# Patient Record
Sex: Male | Born: 1950 | Race: Black or African American | Hispanic: No | Marital: Married | Smoking: Current every day smoker
Health system: Southern US, Community
[De-identification: ages and names within clinical notes are randomized; demographics above are authoritative.]

## PROBLEM LIST (undated history)

## (undated) DIAGNOSIS — Z720 Tobacco use: Secondary | ICD-10-CM

## (undated) DIAGNOSIS — D649 Anemia, unspecified: Secondary | ICD-10-CM

## (undated) DIAGNOSIS — E538 Deficiency of other specified B group vitamins: Secondary | ICD-10-CM

## (undated) DIAGNOSIS — N529 Male erectile dysfunction, unspecified: Secondary | ICD-10-CM

## (undated) DIAGNOSIS — R972 Elevated prostate specific antigen [PSA]: Secondary | ICD-10-CM

## (undated) DIAGNOSIS — I10 Essential (primary) hypertension: Secondary | ICD-10-CM

## (undated) HISTORY — PX: OTHER SURGICAL HISTORY: SHX169

## (undated) HISTORY — DX: Essential (primary) hypertension: I10

## (undated) HISTORY — DX: Deficiency of other specified B group vitamins: E53.8

## (undated) HISTORY — DX: Tobacco use: Z72.0

## (undated) HISTORY — DX: Anemia, unspecified: D64.9

## (undated) HISTORY — DX: Elevated prostate specific antigen (PSA): R97.20

## (undated) HISTORY — DX: Male erectile dysfunction, unspecified: N52.9

---

## 2007-02-15 ENCOUNTER — Ambulatory Visit: Payer: Self-pay | Admitting: Gastroenterology

## 2014-10-07 ENCOUNTER — Inpatient Hospital Stay: Payer: BC Managed Care – PPO

## 2014-10-07 ENCOUNTER — Encounter: Payer: Self-pay | Admitting: Internal Medicine

## 2014-10-07 ENCOUNTER — Inpatient Hospital Stay: Payer: BC Managed Care – PPO | Attending: Internal Medicine | Admitting: Internal Medicine

## 2014-10-07 VITALS — BP 124/85 | HR 64 | Temp 98.4°F | Resp 18 | Ht 66.6 in | Wt 180.6 lb

## 2014-10-07 DIAGNOSIS — I10 Essential (primary) hypertension: Secondary | ICD-10-CM | POA: Insufficient documentation

## 2014-10-07 DIAGNOSIS — F17210 Nicotine dependence, cigarettes, uncomplicated: Secondary | ICD-10-CM

## 2014-10-07 DIAGNOSIS — D649 Anemia, unspecified: Secondary | ICD-10-CM | POA: Diagnosis not present

## 2014-10-07 DIAGNOSIS — Z79899 Other long term (current) drug therapy: Secondary | ICD-10-CM | POA: Insufficient documentation

## 2014-10-07 DIAGNOSIS — E538 Deficiency of other specified B group vitamins: Secondary | ICD-10-CM | POA: Insufficient documentation

## 2014-10-07 DIAGNOSIS — N529 Male erectile dysfunction, unspecified: Secondary | ICD-10-CM | POA: Insufficient documentation

## 2014-10-07 LAB — IRON AND TIBC
IRON: 72 ug/dL (ref 45–182)
SATURATION RATIOS: 19 % (ref 17.9–39.5)
TIBC: 376 ug/dL (ref 250–450)
UIBC: 304 ug/dL

## 2014-10-07 LAB — FERRITIN: FERRITIN: 120 ng/mL (ref 24–336)

## 2014-10-07 LAB — LACTATE DEHYDROGENASE: LDH: 131 U/L (ref 98–192)

## 2014-10-07 LAB — FOLATE: FOLATE: 8.4 ng/mL (ref >5.9)

## 2014-10-07 LAB — DAT, POLYSPECIFIC AHG (ARMC ONLY): POLYSPECIFIC AHG TEST: NEGATIVE

## 2014-10-07 NOTE — Progress Notes (Signed)
Pt here for anemia, he does feel tired after a long day work sometimes.  Had colonoscopy 2012 few polyps. Does not see blood or stool in urine

## 2014-10-08 LAB — RETICULOCYTES
RBC.: 4.64 MIL/uL (ref 4.40–5.90)
RETIC COUNT ABSOLUTE: 46.4 10*3/uL (ref 19.0–183.0)
Retic Ct Pct: 1 % (ref 0.4–3.1)

## 2014-10-08 LAB — KAPPA/LAMBDA LIGHT CHAINS
Kappa free light chain: 28.82 mg/L — ABNORMAL HIGH (ref 3.30–19.40)
Kappa, lambda light chain ratio: 1.87 — ABNORMAL HIGH (ref 0.26–1.65)
LAMDA FREE LIGHT CHAINS: 15.41 mg/L (ref 5.71–26.30)

## 2014-10-08 LAB — PROTEIN ELECTROPHORESIS, SERUM
A/G RATIO SPE: 1 (ref 0.7–1.7)
ALBUMIN ELP: 3.7 g/dL (ref 2.9–4.4)
ALPHA-2-GLOBULIN: 0.8 g/dL (ref 0.4–1.0)
Alpha-1-Globulin: 0.3 g/dL (ref 0.0–0.4)
BETA GLOBULIN: 1.1 g/dL (ref 0.7–1.3)
GLOBULIN, TOTAL: 3.7 g/dL (ref 2.2–3.9)
Gamma Globulin: 1.6 g/dL (ref 0.4–1.8)
Total Protein ELP: 7.4 g/dL (ref 6.0–8.5)

## 2014-10-08 LAB — HAPTOGLOBIN: Haptoglobin: 167 mg/dL (ref 34–200)

## 2014-10-08 LAB — ERYTHROPOIETIN: ERYTHROPOIETIN: 10.9 m[IU]/mL (ref 2.6–18.5)

## 2014-10-08 LAB — ANA W/REFLEX: Anti Nuclear Antibody(ANA): NEGATIVE

## 2014-10-08 LAB — CBC
HEMATOCRIT: 39.1 % — AB (ref 40.0–52.0)
HEMOGLOBIN: 12.6 g/dL — AB (ref 13.0–18.0)
MCH: 27.2 pg (ref 26.0–34.0)
MCHC: 32.3 g/dL (ref 32.0–36.0)
MCV: 84.2 fL (ref 80.0–100.0)
Platelets: 219 10*3/uL (ref 150–440)
RBC: 4.64 MIL/uL (ref 4.40–5.90)
RDW: 14.5 % (ref 11.5–14.5)
WBC: 7.1 10*3/uL (ref 3.8–10.6)

## 2014-10-09 NOTE — Progress Notes (Signed)
[place]  Telephone:(336) [phone] Fax:(336) [phone]     ID: Dierdre Forth OB: Aug 08, 1950  MR#: [id number]  VAN#:[id number]  Patient Care Team: Marden Noble, MD as PCP - General (Internal Medicine)  CHIEF COMPLAINT/DIAGNOSIS:  Recently diagnosed Anemia  -  patient referred here for Hematology evaluation.  (labs on 09/18/14 showed hemoglobin 12.1, MCV 84.2, hematocrit 37.8%, WBC 5.8, ANC 3.6, platelet count 233, creatinine 1.1, calcium 9.6, LFTs unremarkable, HIV 1/2 negative, HCV antibody negative. Prior to this in August 2015 hemoglobin was better at 13.3, WBC 6.2, platelets 224).   HISTORY OF PRESENT ILLNESS:  Dale Palmer is a 64 year old gentleman with past medical history significant for hypertension, anemia, B-12 deficiency, erectile dysfunction who has been referred here for anemia evaluation. Recent labs done on 09/18/14 showed hemoglobin 12.1, MCV 84.2, hematocrit 37.8%, WBC 5.8, ANC 3.6, platelet count 233, creatinine 1.1, calcium 9.6, LFTs unremarkable, HIV 1/2 negative, HCV antibody negative. Prior to this in August 2015 hemoglobin was doing better at 13.3, WBC 6.2, platelets 224. Clinically patient states that he has mild fatigue on doing physical activity only. Otherwise denies any dyspnea, angina, palpitation, orthopnea or PND. He denies any bleeding symptoms. States that colonoscopy was in 2012, one benign polyp was removed and was told that next one would be at 5 years. Denies any new bone pains. No new paresthesias in the extremities. States that he eats well, denies unintentional weight loss.  REVIEW OF SYSTEMS:   ROS CONSTITUTIONAL: As in HPI above. No chills, fever or sweats.    ENT:  No headache, dizziness or epistaxis. No ear or jaw pain. No sinus symptoms. RESPIRATORY:   No cough.  No shortness of breath. No wheezing. No hemoptysis. CARDIAC:  No palpitations.  No retrosternal chest pain. No orthopnea, PND. GI:  No abdominal pain, nausea or  vomiting. No diarrhea.   GU:  No dysuria or hematuria.  SKIN: No rashes or pruritus. HEMATOLOGIC: denies bleeding symptoms MUSCULOSKELETAL:  No new bone pains.  EXTREMITY:  No new swelling or pain.  NEURO:  No focal weakness. No numbness or tingling of extremities.  No seizures.   ENDOCRINE:  No polyuria or polydipsia.   PAST MEDICAL HISTORY: Reviewed. Past Medical History  Diagnosis Date  . Hypertension   . Anemia   . B12 deficiency   . Erectile dysfunction     PAST SURGICAL HISTORY: Reviewed. History reviewed. No pertinent past surgical history.  FAMILY HISTORY: Reviewed. History reviewed. No pertinent family history.  SOCIAL HISTORY: Reviewed. Social History  Substance Use Topics  . Smoking status: Current Every Day Smoker -- 0.50 packs/day  . Smokeless tobacco: None  . Alcohol Use: Yes     Comment: occasional basis like at a birthday party    Allergies not on file  Current Outpatient Prescriptions  Medication Sig Dispense Refill  . Cyanocobalamin (VITAMIN B 12 PO) Take 1,000 mcg by mouth daily.    Marland Kitchen lisinopril-hydrochlorothiazide (PRINZIDE,ZESTORETIC) 20-25 MG per tablet Take 1 tablet by mouth daily.     No current facility-administered medications for this visit.    PHYSICAL EXAM: Filed Vitals:   10/07/14 1421  BP: 124/85  Pulse: 64  Temp: 98.4 F (36.9 C)  Resp: 18     Body mass index is 28.61 kg/(m^2).      GENERAL: Patient is alert and oriented and in no acute distress. There is no icterus or pallor. HEENT: EOMs intact. No cervical lymphadenopathy. CVS: S1S2, regular LUNGS: Bilaterally clear to auscultation,  no rhonchi. ABDOMEN: Soft, nontender. No hepatosplenomegaly clinically.  NEURO: grossly nonfocal, cranial nerves are intact.  EXTREMITIES: No pedal edema. LYMPHATICS: No palpable adenopathy in axillary or inguinal areas. SKIN: No rash or major bruising. MUSCULOSKELETAL: No obvious joint redness or swelling.   LAB RESULTS: 09/18/14 showed  hemoglobin 12.1, MCV 84.2, hematocrit 37.8%, WBC 5.8, ANC 3.6, platelet count 233, creatinine 1.1, calcium 9.6, LFTs unremarkable, HIV 1/2 negative, HCV antibody negative.  August 2015 - hemoglobin was 13.3, WBC 6.2, platelets 224.   ASSESSMENT / PLAN:   Recently diagnosed Anemia   (labs on 09/18/14 showed hemoglobin 12.1, MCV 84.2, hematocrit 37.8%, WBC 5.8, ANC 3.6, platelet count 233, creatinine 1.1, calcium 9.6, LFTs unremarkable, HIV 1/2 negative, HCV antibody negative. Prior to this in August 2015 hemoglobin was better at 13.3, WBC 6.2, platelets 224)  -  patient referred here for Hematology evaluation. Have reviewed records and labs sent by referring physician and discussed with patient. He has mild anemia of unclear etiology. Recent anemia workup including CBC with differential, reticulocyte count, B12, folate, iron study, LDH/haptoglobin/DAT test to rule out hemolysis, serum EPO level, ANA, serum protein electrophoresis and serum kappa/lambda light chain ratio. Will follow up in 1 week and make further recommendations based upon results of workup, including decide if he would need bone marrow biopsy evaluation. Patient otherwise does not have significant anemia symptoms. In between visits, the patient has been advised to call or come to the ER in case of fevers, bleeding, acute sickness or new symptoms. Patient agreeable to this plan.   Leia Alf, MD   10/09/2014 12:28 PM

## 2014-10-14 ENCOUNTER — Ambulatory Visit: Payer: BC Managed Care – PPO | Admitting: Internal Medicine

## 2014-10-21 ENCOUNTER — Other Ambulatory Visit: Payer: Self-pay

## 2014-10-21 ENCOUNTER — Inpatient Hospital Stay (HOSPITAL_BASED_OUTPATIENT_CLINIC_OR_DEPARTMENT_OTHER): Payer: BC Managed Care – PPO | Admitting: Internal Medicine

## 2014-10-21 VITALS — BP 119/73 | HR 62 | Temp 98.2°F | Resp 18 | Ht 66.6 in | Wt 182.5 lb

## 2014-10-21 DIAGNOSIS — E538 Deficiency of other specified B group vitamins: Secondary | ICD-10-CM

## 2014-10-21 DIAGNOSIS — F17210 Nicotine dependence, cigarettes, uncomplicated: Secondary | ICD-10-CM | POA: Diagnosis not present

## 2014-10-21 DIAGNOSIS — N529 Male erectile dysfunction, unspecified: Secondary | ICD-10-CM

## 2014-10-21 DIAGNOSIS — D649 Anemia, unspecified: Secondary | ICD-10-CM | POA: Diagnosis not present

## 2014-10-21 DIAGNOSIS — I10 Essential (primary) hypertension: Secondary | ICD-10-CM | POA: Diagnosis not present

## 2014-10-21 DIAGNOSIS — Z79899 Other long term (current) drug therapy: Secondary | ICD-10-CM

## 2014-10-21 LAB — OCCULT BLOOD X 1 CARD TO LAB, STOOL
FECAL OCCULT BLD: NEGATIVE
FECAL OCCULT BLD: NEGATIVE

## 2014-11-01 NOTE — Progress Notes (Signed)
[place]  Telephone:([phone] Fax:(336) [phone]     ID: Dierdre Forth OB: July 25, 1950  MR#: [id number]  GYF#:[id number]  Patient Care Team: Marden Noble, MD as PCP - General (Internal Medicine)  CHIEF COMPLAINT/DIAGNOSIS:  Recently diagnosed Anemia of unclear etiology - mild, asymptomatic.  Workup done on 10/07/14 - hemoglobin 12.6, MCV 84, WBC 7.1, platelets 290. Iron study unremarkable. LDH/haptoglobin/DHD test unremarkable. SPEP negative for M-spike. Serum kappa light chains mildly elevated at 28.82, lambda light chains normal at 15.41, serum kappa/lambda ratio mildly abnormal at 1.87. Serum EPO 10.9. ANA negative. Stool Hemoccult negative 2.  (Prior labs on 09/18/14 showed hemoglobin 12.1, MCV 84.2, hematocrit 37.8%, WBC 5.8, ANC 3.6, platelet count 233, creatinine 1.1, calcium 9.6, LFTs unremarkable, HIV 1/2 negative, HCV antibody negative. Prior to this in August 2015 hemoglobin was better at 13.3, WBC 6.2, platelets 224).   HISTORY OF PRESENT ILLNESS:  Patient returns for continued hematology follow-up, he had labs done on September 14 as described above. Clinically states that he is doing well, he gets mild fatigue on doing physical activity only. Otherwise denies any dyspnea, angina, palpitation, orthopnea or PND. He denies any bleeding symptoms. States that colonoscopy was in 2012, one benign polyp was removed and was told that next one would be at 5 years. States that he eats well.  REVIEW OF SYSTEMS:   ROS As in HPI above. No chills, fever or sweats.  No shortness of breath. No wheezing. No abdominal pain, nausea or vomiting. No diarrhea. No new bone pains.   PAST MEDICAL HISTORY: Reviewed. Past Medical History  Diagnosis Date  . Hypertension   . Anemia   . B12 deficiency   . Erectile dysfunction     PAST SURGICAL HISTORY: Reviewed. No past surgical history on file.  FAMILY HISTORY: Reviewed. No family history on file.  SOCIAL HISTORY:  Reviewed. Social History  Substance Use Topics  . Smoking status: Current Every Day Smoker -- 0.50 packs/day  . Smokeless tobacco: Not on file  . Alcohol Use: Yes     Comment: occasional basis like at a birthday party    Allergies not on file  Current Outpatient Prescriptions  Medication Sig Dispense Refill  . Cyanocobalamin (VITAMIN B 12 PO) Take 1,000 mcg by mouth daily.    Marland Kitchen lisinopril-hydrochlorothiazide (PRINZIDE,ZESTORETIC) 20-25 MG per tablet Take 1 tablet by mouth daily.     No current facility-administered medications for this visit.    PHYSICAL EXAM: Filed Vitals:   10/21/14 1433  BP: 119/73  Pulse: 62  Temp: 98.2 F (36.8 C)  Resp: 18     Body mass index is 28.92 kg/(m^2).      GENERAL: Patient is alert and oriented and in no acute distress. There is no icterus or pallor. LUNGS: Bilaterally clear to auscultation, no rhonchi. ABDOMEN: Soft, nontender.   EXTREMITIES: No pedal edema.   LAB RESULTS: 09/18/14 showed hemoglobin 12.1, MCV 84.2, hematocrit 37.8%, WBC 5.8, ANC 3.6, platelet count 233, creatinine 1.1, calcium 9.6, LFTs unremarkable, HIV 1/2 negative, HCV antibody negative.  August 2015 - hemoglobin was 13.3, WBC 6.2, platelets 224.   ASSESSMENT / PLAN:   Recently diagnosed Anemia of unclear etiology  (Workup done on 10/07/14 - hemoglobin 12.6, MCV 84, WBC 7.1, platelets 290. Iron study unremarkable. LDH/haptoglobin/DHD test unremarkable. SPEP negative for M-spike. Serum kappa light chains mildly elevated at 28.82, lambda light chains normal at 15.41, serum kappa/lambda ratio mildly abnormal at 1.87. Serum EPO 10.9. ANA negative. Stool  Hemoccult negative 2)  -  reviewed labs done and discussed with patient. Hemoglobin is just below normal range and he does not have any symptoms. Given this, plan is continued monitoring and will see him back at 24 weeks with repeat labs. He has mildly elevated serum kappa light chains and mildly elevated serum kappa/lambda  ratio, this is likely not significant at this time but will continue to monitor and repeat these levels when he comes back at 24 weeks.  In between visits, the patient has been advised to call or come to the ER in case of fevers, bleeding, acute sickness or new symptoms. Patient agreeable to this plan.   Leia Alf, MD   11/01/2014 9:09 AM

## 2015-04-06 ENCOUNTER — Other Ambulatory Visit: Payer: Self-pay | Admitting: *Deleted

## 2015-04-06 DIAGNOSIS — D649 Anemia, unspecified: Secondary | ICD-10-CM

## 2015-04-07 ENCOUNTER — Inpatient Hospital Stay: Payer: BC Managed Care – PPO

## 2015-04-07 ENCOUNTER — Inpatient Hospital Stay: Payer: BC Managed Care – PPO | Admitting: Internal Medicine

## 2015-04-29 ENCOUNTER — Inpatient Hospital Stay: Payer: BC Managed Care – PPO | Attending: Internal Medicine

## 2015-04-29 ENCOUNTER — Inpatient Hospital Stay: Payer: BC Managed Care – PPO | Admitting: Internal Medicine

## 2015-09-22 DIAGNOSIS — R972 Elevated prostate specific antigen [PSA]: Secondary | ICD-10-CM | POA: Insufficient documentation

## 2015-09-22 HISTORY — DX: Elevated prostate specific antigen (PSA): R97.20

## 2016-09-27 ENCOUNTER — Telehealth: Payer: Self-pay | Admitting: *Deleted

## 2016-09-27 NOTE — Telephone Encounter (Signed)
Received referral for low dose lung cancer screening CT scan. Attempted to leave message at phone number listed in EMR for patient to call me back to facilitate scheduling scan. However, this option is not available. Will attempt to contact at later date.

## 2016-09-28 ENCOUNTER — Telehealth: Payer: Self-pay | Admitting: *Deleted

## 2016-09-28 NOTE — Telephone Encounter (Signed)
Received referral for low dose lung cancer screening CT scan. Attempted to leave message at phone number listed in EMR for patient to call me back to facilitate scheduling scan. However, this option is not available. 

## 2016-10-02 ENCOUNTER — Telehealth: Payer: Self-pay | Admitting: *Deleted

## 2016-10-02 DIAGNOSIS — Z87891 Personal history of nicotine dependence: Secondary | ICD-10-CM

## 2016-10-02 DIAGNOSIS — Z122 Encounter for screening for malignant neoplasm of respiratory organs: Secondary | ICD-10-CM

## 2016-10-02 NOTE — Telephone Encounter (Signed)
Received referral for initial lung cancer screening scan. Contacted patient and obtained smoking history,(current, 33 pack year) as well as answering questions related to screening process. Patient denies signs of lung cancer such as weight loss or hemoptysis. Patient denies comorbidity that would prevent curative treatment if lung cancer were found. Patient is scheduled for shared decision making visit and CT scan on 10/11/16.

## 2016-10-11 ENCOUNTER — Inpatient Hospital Stay: Payer: BC Managed Care – PPO | Attending: Nurse Practitioner | Admitting: Nurse Practitioner

## 2016-10-11 ENCOUNTER — Ambulatory Visit
Admission: RE | Admit: 2016-10-11 | Discharge: 2016-10-11 | Disposition: A | Discharge status: Home / Self Care | Payer: BC Managed Care – PPO | Source: Ambulatory Visit | Attending: Nurse Practitioner | Admitting: Nurse Practitioner

## 2016-10-11 ENCOUNTER — Encounter: Payer: Self-pay | Admitting: Nurse Practitioner

## 2016-10-11 DIAGNOSIS — F17210 Nicotine dependence, cigarettes, uncomplicated: Secondary | ICD-10-CM

## 2016-10-11 DIAGNOSIS — Z122 Encounter for screening for malignant neoplasm of respiratory organs: Secondary | ICD-10-CM | POA: Insufficient documentation

## 2016-10-11 DIAGNOSIS — I700 Atherosclerosis of aorta: Secondary | ICD-10-CM | POA: Diagnosis not present

## 2016-10-11 DIAGNOSIS — Z720 Tobacco use: Secondary | ICD-10-CM

## 2016-10-11 DIAGNOSIS — Z87891 Personal history of nicotine dependence: Secondary | ICD-10-CM | POA: Insufficient documentation

## 2016-10-11 DIAGNOSIS — J439 Emphysema, unspecified: Secondary | ICD-10-CM | POA: Diagnosis not present

## 2016-10-11 DIAGNOSIS — I2510 Atherosclerotic heart disease of native coronary artery without angina pectoris: Secondary | ICD-10-CM | POA: Insufficient documentation

## 2016-10-11 HISTORY — DX: Tobacco use: Z72.0

## 2016-10-11 NOTE — Progress Notes (Signed)
In accordance with CMS guidelines, patient has met eligibility criteria including age, absence of signs or symptoms of lung cancer.  Social History  Substance Use Topics  . Smoking status: Current Every Day Smoker    Packs/day: 0.75    Years: 44.00  . Smokeless tobacco: Not on file  . Alcohol use Yes     Comment: occasional basis like at a birthday party     A shared decision-making session was conducted prior to the performance of CT scan. This includes one or more decision aids, includes benefits and harms of screening, follow-up diagnostic testing, over-diagnosis, false positive rate, and total radiation exposure.  Counseling on the importance of adherence to annual lung cancer LDCT screening, impact of co-morbidities, and ability or willingness to undergo diagnosis and treatment is imperative for compliance of the program.  Counseling on the importance of continued smoking cessation for former smokers; the importance of smoking cessation for current smokers, and information about tobacco cessation interventions have been given to patient including [place] and [facility] quit [place] programs.  Written order for lung cancer screening with LDCT has been given to the patient and any and all questions have been answered to the best of my abilities.   Yearly follow up will be coordinated by Burgess Estelle, Thoracic Navigator.  Verlon Au NP 10/11/16 4:19 PM

## 2016-10-13 ENCOUNTER — Encounter: Payer: Self-pay | Admitting: *Deleted

## 2017-03-27 ENCOUNTER — Encounter: Payer: Self-pay | Admitting: Urology

## 2017-03-27 ENCOUNTER — Ambulatory Visit: Payer: BC Managed Care – PPO | Admitting: Urology

## 2017-03-27 VITALS — BP 120/73 | HR 66 | Ht 67.0 in | Wt 191.0 lb

## 2017-03-27 DIAGNOSIS — R3915 Urgency of urination: Secondary | ICD-10-CM

## 2017-03-27 DIAGNOSIS — N400 Enlarged prostate without lower urinary tract symptoms: Secondary | ICD-10-CM

## 2017-03-27 DIAGNOSIS — F17200 Nicotine dependence, unspecified, uncomplicated: Secondary | ICD-10-CM

## 2017-03-27 DIAGNOSIS — R3129 Other microscopic hematuria: Secondary | ICD-10-CM | POA: Diagnosis not present

## 2017-03-27 DIAGNOSIS — N401 Enlarged prostate with lower urinary tract symptoms: Secondary | ICD-10-CM

## 2017-03-27 LAB — URINALYSIS, COMPLETE
BILIRUBIN UA: NEGATIVE
Glucose, UA: NEGATIVE
KETONES UA: NEGATIVE
Nitrite, UA: NEGATIVE
PH UA: 6 (ref 5.0–7.5)
PROTEIN UA: NEGATIVE
SPEC GRAV UA: 1.01 (ref 1.005–1.030)
UUROB: 0.2 mg/dL (ref 0.2–1.0)

## 2017-03-27 LAB — MICROSCOPIC EXAMINATION: Bacteria, UA: NONE SEEN

## 2017-03-27 LAB — BLADDER SCAN AMB NON-IMAGING

## 2017-03-27 NOTE — Progress Notes (Signed)
03/27/2017 1:19 PM   Dale Palmer 1950/10/05 [id number]  Referring provider: Marden Noble, MD [address] [place], [place] [address]  Chief Complaint  Patient presents with  . Benign Prostatic Hypertrophy    New Patient    HPI: 67 yo M referred for further evaluation of worsening urinary symptoms.  Over the past year or so, he has noticed increased daytime urinary frequency and urgency.  This is somewhat bothersome to him.  He reports that he feels like fluids go right through him.  He drinks a cup of coffee in the morning and has to void urgently upon arriving to work.  He then takes a urinary break just shortly after that.  He drinks a large ice tea from [employer] at lunchtime and voids currently in the afternoon following this as well.  He does have some urgency but no urge incontinence.  He has minimal nighttime symptoms.  He denies any gross hematuria, UTIs, or flank pain.  He does take lisinopril/hydrochlorothiazide in the mornings.  He is a mildly elevated/fluctuating PSA.  PSA trend as below.  He is a current smoker.  He does have evidence of microscopic hematuria today on urinalysis.  No previous UAs for review.  He has been on Flomax for the past several years.  He has never had a urologic evaluation in the past.  PSA trend: 3.68 on 03/2017 4.38 on 08/2016 3.55 on 02/2016 5.95 on 08/2015 3.27 on 08/2013   IPSS    Row Name 03/27/17 1000         International Prostate Symptom Score   How often have you had the sensation of not emptying your bladder?  Less than 1 in 5     How often have you had to urinate less than every two hours?  About half the time     How often have you found you stopped and started again several times when you urinated?  About half the time     How often have you found it difficult to postpone urination?  About half the time     How often have you had a weak urinary stream?  Not at All     How often have you had to strain to start  urination?  Not at All     How many times did you typically get up at night to urinate?  None     Total IPSS Score  10       Quality of Life due to urinary symptoms   If you were to spend the rest of your life with your urinary condition just the way it is now how would you feel about that?  Unhappy        Score:  1-7 Mild 8-19 Moderate 20-35 Severe   PMH: Past Medical History:  Diagnosis Date  . Anemia   . B12 deficiency   . Erectile dysfunction   . Hypertension     Surgical History: Past Surgical History:  Procedure Laterality Date  . none      Home Medications:  Allergies as of 03/27/2017   No Known Allergies     Medication List        Accurate as of 03/27/17  1:19 PM. Always use your most recent med list.          aspirin EC 81 MG tablet TAKE 1 TABLET (81 MG TOTAL) BY MOUTH ONCE DAILY.   lisinopril-hydrochlorothiazide 20-25 MG tablet Commonly known as:  PRINZIDE,ZESTORETIC Take  1 tablet by mouth daily.   tamsulosin 0.4 MG Caps capsule Commonly known as:  FLOMAX TAKE 1 CAPSULE (0.4 MG TOTAL) BY MOUTH ONCE DAILY. TAKE 30 MINUTES AFTER SAME MEAL EACH DAY.   VITAMIN B 12 PO Take 1,000 mcg by mouth daily.       Allergies: No Known Allergies  Family History: Family History  Problem Relation Age of Onset  . Prostate cancer Neg Hx   . Kidney cancer Neg Hx     Social History:  reports that he has been smoking.  He has a 33.00 pack-year smoking history. he has never used smokeless tobacco. He reports that he drinks alcohol. He reports that he does not use drugs.  ROS: UROLOGY Frequent Urination?: Yes Hard to postpone urination?: No Burning/pain with urination?: No Get up at night to urinate?: No Leakage of urine?: Yes Urine stream starts and stops?: No Trouble starting stream?: No Do you have to strain to urinate?: No Blood in urine?: No Urinary tract infection?: No Sexually transmitted disease?: No Injury to kidneys or bladder?: No Painful  intercourse?: No Weak stream?: No Erection problems?: Yes Penile pain?: No  Gastrointestinal Nausea?: No Vomiting?: No Indigestion/heartburn?: No Diarrhea?: No Constipation?: No  Constitutional Fever: No Night sweats?: No Weight loss?: No Fatigue?: No  Skin Skin rash/lesions?: No Itching?: No  Eyes Blurred vision?: No Double vision?: No  Ears/Nose/Throat Sore throat?: No Sinus problems?: No  Hematologic/Lymphatic Swollen glands?: No Easy bruising?: No  Cardiovascular Leg swelling?: No Chest pain?: No  Respiratory Cough?: No Shortness of breath?: No  Endocrine Excessive thirst?: No  Musculoskeletal Back pain?: No Joint pain?: No  Neurological Headaches?: No Dizziness?: No  Psychologic Depression?: No Anxiety?: No  Physical Exam: BP 120/73   Pulse 66   Ht 5\' 7"  (1.702 m)   Wt 191 lb (86.6 kg)   BMI 29.91 kg/m   Constitutional:  Alert and oriented, No acute distress. HEENT: [place] AT, moist mucus membranes.  Trachea midline, no masses. Cardiovascular: No clubbing, cyanosis, or edema. Respiratory: Normal respiratory effort, no increased work of breathing. GI: Abdomen is soft, nontender, nondistended, no abdominal masses GU: No CVA tenderness.  Rectal: External hemorrhoids appreciated.  40 cc prostate, nontender, no nodules. Skin: No rashes, bruises or suspicious lesions. Neurologic: Grossly intact, no focal deficits, moving all 4 extremities. Psychiatric: Normal mood and affect.  Laboratory Data: Labs from 08/2016 reviewed Hgb 12.6 PSA 3.68 Cr 1.1  Urinalysis UA today unremarkable other than 3-10 red blood cells per high-powered field.  Pertinent Imaging: N/a  Assessment & Plan:   1. Urinary urgency UA and bladder scan unremarkable Suspect urgency/frequency may be behavioral related combined with a.m. diuretic Recommend further workup for microscopic hematuria with consideration of adding anticholinergic if the workup is negative -  Urinalysis, Complete - BLADDER SCAN AMB NON-IMAGING  2. Microscopic hematuria We discussed the differential diagnosis for microscopic hematuria including nephrolithiasis, renal or upper tract tumors, bladder stones, UTIs, or bladder tumors as well as undetermined etiologies. Per AUA guidelines, I did recommend complete microscopic hematuria evaluation including CTU, possible urine cytology, and office cystoscopy.  - CT HEMATURIA WORKUP; Future  3. Smoker Risk factor for bladder cancer Encouraged smoking cessation  4. Benign prostatic hyperplasia (BPH) with urinary urgency Continue Flomax As above  Return for cysto/ f/u CT urogram.  Hollice Espy, MD  [facility] [address], [place], [place] [address] [phone]

## 2017-04-19 ENCOUNTER — Ambulatory Visit
Admission: RE | Admit: 2017-04-19 | Discharge: 2017-04-19 | Disposition: A | Discharge status: Home / Self Care | Payer: BC Managed Care – PPO | Source: Ambulatory Visit | Attending: Urology | Admitting: Urology

## 2017-04-19 DIAGNOSIS — N400 Enlarged prostate without lower urinary tract symptoms: Secondary | ICD-10-CM | POA: Insufficient documentation

## 2017-04-19 DIAGNOSIS — R3129 Other microscopic hematuria: Secondary | ICD-10-CM | POA: Diagnosis present

## 2017-04-19 DIAGNOSIS — K449 Diaphragmatic hernia without obstruction or gangrene: Secondary | ICD-10-CM | POA: Diagnosis not present

## 2017-04-19 DIAGNOSIS — K4090 Unilateral inguinal hernia, without obstruction or gangrene, not specified as recurrent: Secondary | ICD-10-CM | POA: Insufficient documentation

## 2017-04-19 DIAGNOSIS — M47816 Spondylosis without myelopathy or radiculopathy, lumbar region: Secondary | ICD-10-CM | POA: Insufficient documentation

## 2017-04-19 DIAGNOSIS — I700 Atherosclerosis of aorta: Secondary | ICD-10-CM | POA: Diagnosis not present

## 2017-04-19 DIAGNOSIS — G544 Lumbosacral root disorders, not elsewhere classified: Secondary | ICD-10-CM | POA: Diagnosis not present

## 2017-04-19 DIAGNOSIS — I2510 Atherosclerotic heart disease of native coronary artery without angina pectoris: Secondary | ICD-10-CM | POA: Diagnosis not present

## 2017-04-19 DIAGNOSIS — M5136 Other intervertebral disc degeneration, lumbar region: Secondary | ICD-10-CM | POA: Diagnosis not present

## 2017-04-19 MED ORDER — IOPAMIDOL (ISOVUE-300) INJECTION 61%
125.0000 mL | Freq: Once | INTRAVENOUS | Status: AC | PRN
  Administered 2017-04-19: 125 mL via INTRAVENOUS

## 2017-04-24 ENCOUNTER — Encounter: Payer: Self-pay | Admitting: Urology

## 2017-04-24 ENCOUNTER — Ambulatory Visit (INDEPENDENT_AMBULATORY_CARE_PROVIDER_SITE_OTHER): Payer: BC Managed Care – PPO | Admitting: Urology

## 2017-04-24 VITALS — BP 126/77 | HR 67 | Ht 67.0 in | Wt 191.0 lb

## 2017-04-24 DIAGNOSIS — R3129 Other microscopic hematuria: Secondary | ICD-10-CM

## 2017-04-24 DIAGNOSIS — R3915 Urgency of urination: Secondary | ICD-10-CM

## 2017-04-24 DIAGNOSIS — K4020 Bilateral inguinal hernia, without obstruction or gangrene, not specified as recurrent: Secondary | ICD-10-CM

## 2017-04-24 LAB — MICROSCOPIC EXAMINATION
Bacteria, UA: NONE SEEN
Epithelial Cells (non renal): NONE SEEN /hpf (ref 0–10)
WBC, UA: NONE SEEN /hpf (ref 0–5)

## 2017-04-24 LAB — URINALYSIS, COMPLETE
Bilirubin, UA: NEGATIVE
GLUCOSE, UA: NEGATIVE
KETONES UA: NEGATIVE
LEUKOCYTES UA: NEGATIVE
Nitrite, UA: NEGATIVE
Protein, UA: NEGATIVE
SPEC GRAV UA: 1.02 (ref 1.005–1.030)
Urobilinogen, Ur: 0.2 mg/dL (ref 0.2–1.0)
pH, UA: 7 (ref 5.0–7.5)

## 2017-04-24 NOTE — Progress Notes (Signed)
   04/24/17  CC:  Chief Complaint  Patient presents with  . Cysto    HPI: 67 year old male with urinary frequency/urgency and microscopic hematuria who presents today for cystoscopy for further evaluation.  CT urogram performed on 04/19/2017 personally reviewed today with the patient.  This shows bilateral direct inguinal hernias, right greater than left with a good portion of his bladder herniated into the right hemiscrotum along with a loop of small bowel.  There is also inguinal as well.  He does note today that he does know in the right scrotum particularly in the when he wakes improves after voiding.  He denies any inguinal pain bilaterally.  No previous abdominal abdominal surgeries or hernia repairs.  He was also noted to have prostamegaly on CT scan with notable asymmetric enhancement of the right peripheral zone, volume approximately 80 cc.  There is also a borderline enlarged right internal iliac node.    He does have a personal history of fluctuating PSA with most recent PSA back to his baseline of 3.6213/2019.  Prostate exam was enlarged but otherwise unremarkable.  Blood pressure 126/77, pulse 67, height 5\' 7"  (1.702 m), weight 191 lb (86.6 kg). NED. A&Ox3.   No respiratory distress   Abd soft, NT, ND Normal phallus with bilateral descended testicles  Cystoscopy Procedure Note  Patient identification was confirmed, informed consent was obtained, and patient was prepped using Betadine solution.  Lidocaine jelly was administered per urethral meatus.    Preoperative abx where received prior to procedure.     Pre-Procedure: - Inspection reveals a normal caliber ureteral meatus.  Procedure: The flexible cystoscope was introduced without difficulty - No urethral strictures/lesions are present. - Enlarged prostate with bilobar coaptation, approximately 5 cm prostatic length - Normal bladder neck - Bilateral ureteral orifices identified - Bladder mucosa  reveals no  ulcers, tumors, or lesions - No bladder stones - No trabeculation -Defect in the right lateral wall of the bladder with a small opening which was able to accommodate the cystoscope.  I was able to scope to the defect leading into the right upper hemiscrotum.  The scope was palpable within the right hemiscrotum.  There is no lesions within the herniated bladder.  Retroflexion shows unremarkable without evidence of intravesical protrusion.    Post-Procedure: - Patient tolerated the procedure well  Assessment/ Plan:   1. Microscopic hematuria Etiology of microscopic hematuria unclear, possibly related to prostamegaly No bladder lesions on cystoscopy No renal or upper tract lesions which is reassuring - Urinalysis, Complete  2. Urinary urgency Possibly related or at least exacerbated by fairly significant herniation of the bladder and likely incomplete bladder emptying as a result See #3  3. Non-recurrent bilateral inguinal hernia without obstruction or gangrene Bilateral direct inguinal hernias containing bowel and bladder likely contributing to bladder symptoms Recommend surgical repair-referral to general surgery today We will follow-up with voiding symptoms after hernia repair to assess for refractory symptoms, continue Flomax for the time being  4.  Prostamegaly/BPH/fluctuating PSA Nonspecific findings on CT scan, will continue to monitor PSA/rectal exam Most recent PSA reassuring with downward trend  Return in about 3 months (around 07/24/2017) for IPSS/ PVR/ UA.   Hollice Espy, MD

## 2017-06-25 ENCOUNTER — Telehealth: Payer: Self-pay | Admitting: Urology

## 2017-06-25 DIAGNOSIS — K4020 Bilateral inguinal hernia, without obstruction or gangrene, not specified as recurrent: Secondary | ICD-10-CM

## 2017-06-25 NOTE — Telephone Encounter (Signed)
Pt stopped by office today and was supposed to get a referral to general surgery for hernia repair and no one has contacted him about this.  Please advise.

## 2017-07-12 DIAGNOSIS — I10 Essential (primary) hypertension: Secondary | ICD-10-CM | POA: Insufficient documentation

## 2017-07-12 DIAGNOSIS — N529 Male erectile dysfunction, unspecified: Secondary | ICD-10-CM

## 2017-07-12 DIAGNOSIS — D649 Anemia, unspecified: Secondary | ICD-10-CM | POA: Insufficient documentation

## 2017-07-12 DIAGNOSIS — E538 Deficiency of other specified B group vitamins: Secondary | ICD-10-CM | POA: Insufficient documentation

## 2017-07-12 HISTORY — DX: Male erectile dysfunction, unspecified: N52.9

## 2017-07-16 ENCOUNTER — Ambulatory Visit (INDEPENDENT_AMBULATORY_CARE_PROVIDER_SITE_OTHER): Payer: BC Managed Care – PPO | Admitting: Surgery

## 2017-07-16 ENCOUNTER — Encounter: Payer: Self-pay | Admitting: Surgery

## 2017-07-16 VITALS — BP 134/89 | HR 76 | Temp 98.1°F | Ht 67.0 in | Wt 184.0 lb

## 2017-07-16 DIAGNOSIS — K4020 Bilateral inguinal hernia, without obstruction or gangrene, not specified as recurrent: Secondary | ICD-10-CM | POA: Diagnosis not present

## 2017-07-16 NOTE — Patient Instructions (Signed)
Inguinal Hernia, Adult An inguinal hernia is when fat or the intestines push through the area where the leg meets the lower belly (groin) and make a rounded lump (bulge). This condition happens over time. There are three types of inguinal hernias. These types include:  Hernias that can be pushed back into the belly (are reducible).  Hernias that cannot be pushed back into the belly (are incarcerated).  Hernias that cannot be pushed back into the belly and lose their blood supply (get strangulated). This type needs emergency surgery.  Follow these instructions at home: Lifestyle  Drink enough fluid to keep your urine (pee) clear or pale yellow.  Eat plenty of fruits, vegetables, and whole grains. These have a lot of fiber. Talk with your doctor if you have questions.  Avoid lifting heavy objects.  Avoid standing for long periods of time.  Do not use tobacco products. These include cigarettes, chewing tobacco, or e-cigarettes. If you need help quitting, ask your doctor.  Try to stay at a healthy weight. General instructions  Do not try to force the hernia back in.  Watch your hernia for any changes in color or size. Let your doctor know if there are any changes.  Take over-the-counter and prescription medicines only as told by your doctor.  Keep all follow-up visits as told by your doctor. This is important. Contact a doctor if:  You have a fever.  You have new symptoms.  Your symptoms get worse. Get help right away if:  The area where the legs meets the lower belly has: ? Pain that gets worse suddenly. ? A bulge that gets bigger suddenly and does not go down. ? A bulge that turns red or purple. ? A bulge that is painful to the touch.  You are a man and your scrotum: ? Suddenly feels painful. ? Suddenly changes in size.  You feel sick to your stomach (nauseous) and this feeling does not go away.  You throw up (vomit) and this keeps happening.  You feel your heart  beating a lot more quickly than normal.  You cannot poop (have a bowel movement) or pass gas. This information is not intended to replace advice given to you by your health care provider. Make sure you discuss any questions you have with your health care provider. Document Released: 02/09/2006 Document Revised: 06/17/2015 Document Reviewed: 11/19/2013 Elsevier Interactive Patient Education  2018 Elsevier Inc.  

## 2017-07-16 NOTE — Progress Notes (Signed)
Dale Palmer is an 67 y.o. male.   Chief Complaint: Bilateral groin pain Consult requested by Dr. Erlene Quan HPI: This patient with bilateral groin pain referred over for bilateral inguinal hernias. Referred for bilat hernias but only Right one is symptomatic. Has BPH sx on Flomax. Smokes.  No nausea vomiting fevers or chills and no groin pain today.  Past Medical History:  Diagnosis Date  . Anemia   . B12 deficiency   . ED (erectile dysfunction) 07/12/2017  . Elevated PSA, less than 10 ng/ml 09/22/2015  . Erectile dysfunction   . Hypertension   . Tobacco abuse 10/11/2016    Past Surgical History:  Procedure Laterality Date  . none      Family History  Problem Relation Age of Onset  . Prostate cancer Neg Hx   . Kidney cancer Neg Hx    Social History:  reports that he has been smoking.  He has a 33.00 pack-year smoking history. He has never used smokeless tobacco. He reports that he drinks alcohol. He reports that he does not use drugs.  Allergies: No Known Allergies   (Not in a hospital admission)   Review of Systems:   Review of Systems  Constitutional: Negative.   HENT: Negative.   Eyes: Negative.   Respiratory: Negative.   Cardiovascular: Negative.   Genitourinary: Negative.   Musculoskeletal: Negative.   Skin: Negative.   Neurological: Negative.   Endo/Heme/Allergies: Negative.   Psychiatric/Behavioral: Negative.     Physical Exam:  Physical Exam  Constitutional: He is oriented to person, place, and time. He appears well-developed and well-nourished. No distress.  HENT:  Head: Normocephalic and atraumatic.  Eyes: Pupils are equal, round, and reactive to light. EOM are normal. Right eye exhibits no discharge. Left eye exhibits no discharge. No scleral icterus.  Neck: Normal range of motion. Neck supple.  Cardiovascular: Normal rate, regular rhythm and normal heart sounds.  No murmur heard. Pulmonary/Chest: Effort normal and breath sounds normal. No  stridor. No respiratory distress. He has no wheezes.  Abdominal: Soft.  Genitourinary: Penis normal.  Musculoskeletal: Normal range of motion. He exhibits no edema or deformity.  Neurological: He is alert and oriented to person, place, and time.  Skin: Skin is warm and dry. He is not diaphoretic. No erythema.  Psychiatric: He has a normal mood and affect. Judgment normal.  Vitals reviewed.  Patient examined standing supine and with Valsalva.  He has a rather large right inguinal hernia and a smaller but obvious left inguinal hernia testicles are normal penis is normal. There were no vitals taken for this visit.    No results found for this or any previous visit (from the past 48 hour(s)). No results found.   Assessment/Plan Bilateral inguinal hernias.  The right side is minimally symptomatic.  He works as a Retail buyer.  He does smoke tobacco.  Patient is very minimally symptomatic and is not inclined to have surgery at this time.  He wishes to wait.  I discussed with him the procedure and the risks in detail and he will call me with the decision or see me back in the office at some point.  Florene Glen, MD, FACS

## 2017-08-07 ENCOUNTER — Ambulatory Visit (INDEPENDENT_AMBULATORY_CARE_PROVIDER_SITE_OTHER): Payer: BC Managed Care – PPO | Admitting: Surgery

## 2017-08-07 ENCOUNTER — Telehealth: Payer: Self-pay | Admitting: Surgery

## 2017-08-07 ENCOUNTER — Encounter: Payer: Self-pay | Admitting: Surgery

## 2017-08-07 ENCOUNTER — Ambulatory Visit: Payer: BC Managed Care – PPO | Admitting: Urology

## 2017-08-07 VITALS — BP 136/80 | HR 51 | Temp 98.2°F | Ht 67.0 in | Wt 185.6 lb

## 2017-08-07 DIAGNOSIS — K4020 Bilateral inguinal hernia, without obstruction or gangrene, not specified as recurrent: Secondary | ICD-10-CM | POA: Diagnosis not present

## 2017-08-07 NOTE — Progress Notes (Signed)
Outpatient Surgical Follow Up  08/07/2017  Dale Palmer is an 67 y.o. male.   CC: Bilateral inguinal hernias  HPI: This a patient seen back in June with bilateral inguinal hernias which are minimally symptomatic.  He at that time was not inclined to have surgery.  He returns today to discuss options. Patient wishes to proceed with surgery.  He is minimally symptomatic has no pain but notices a bulge on the right side especially No nausea vomiting fevers or chills Past Medical History:  Diagnosis Date  . Anemia   . B12 deficiency   . ED (erectile dysfunction) 07/12/2017  . Elevated PSA, less than 10 ng/ml 09/22/2015  . Erectile dysfunction   . Hypertension   . Tobacco abuse 10/11/2016    Past Surgical History:  Procedure Laterality Date  . none      Family History  Problem Relation Age of Onset  . Prostate cancer Neg Hx   . Kidney cancer Neg Hx     Social History:  reports that he has been smoking.  He has a 33.00 pack-year smoking history. He has never used smokeless tobacco. He reports that he drinks alcohol. He reports that he does not use drugs.  Allergies: No Known Allergies  Medications reviewed.   Review of Systems:   Review of Systems  Constitutional: Negative.   HENT: Negative.   Eyes: Negative.   Respiratory: Negative.   Cardiovascular: Negative.   Genitourinary: Negative.   Musculoskeletal: Negative.   Skin: Negative.   Neurological: Negative.   Endo/Heme/Allergies: Negative.   Psychiatric/Behavioral: Negative.      Physical Exam:  There were no vitals taken for this visit.  Physical Exam  Constitutional: He is oriented to person, place, and time. He appears well-developed and well-nourished. No distress.  HENT:  Head: Normocephalic and atraumatic.  Eyes: Pupils are equal, round, and reactive to light. EOM are normal. Right eye exhibits no discharge. Left eye exhibits no discharge. No scleral icterus.  Neck: Normal range of motion. Neck  supple.  Cardiovascular: Normal rate and regular rhythm.  Pulmonary/Chest: Effort normal. No respiratory distress.  Abdominal: Soft. He exhibits no distension. There is no tenderness.  Genitourinary: Penis normal.  Genitourinary Comments: Bilateral inguinal hernias.  Patient examined standing supine and with Valsalva.  Normal testicles.  Musculoskeletal: Normal range of motion. He exhibits no edema or deformity.  Neurological: He is alert and oriented to person, place, and time.  Skin: Skin is warm and dry. He is not diaphoretic.  Vitals reviewed.     No results found for this or any previous visit (from the past 48 hour(s)). No results found.  Assessment/Plan:  This patient with bilateral inguinal hernias.  They are minimally symptomatic.  He is here to discuss surgical options. Recommend laparoscopic repair of bilateral inguinal hernias.  The rationale for this was discussed the options of observation reviewed and the risk of bleeding infection recurrence ischemic orchitis chronic pain syndrome and neuroma were reviewed with him he understood and agreed to proceed no family was present.  Florene Glen, MD, FACS

## 2017-08-07 NOTE — Telephone Encounter (Signed)
Pt advised of pre op date/time and sx date. Sx: 08/24/17 with Dr Maeola Sarah bilateral inguinal hernia repair.  Pre op: 08/17/17 @ 9:00am--office interview.   Patient made aware to call [phone], between 1-3:00pm the day before surgery, to find out what time to arrive.

## 2017-08-07 NOTE — Patient Instructions (Signed)
You have chose to have your hernia repaired. This will be done by Dr. Burt Knack at [facility].  We will call you with the surgery date.     Please see your (blue) Pre-care information that you have been given today.  You will need to arrange to be out of work for 2 weeks and then return with a lifting restrictions for 4 more weeks. Please send any FMLA paperwork prior to surgery and we will fill this out and fax it back to your employer within 3 business days.  You may have a bruise in your groin and also swelling and brusing in your testicle area. You may use ice 4-5 times daily for 15-20 minutes each time. Make sure that you place a barrier between you and the ice pack. To decrease the swelling, you may roll up a bath towel and place it vertically in between your thighs with your testicles resting on the towel. You will want to keep this area elevated as much as possible for several days following surgery.    Inguinal Hernia, Adult Muscles help keep everything in the body in its proper place. But if a weak spot in the muscles develops, something can poke through. That is called a hernia. When this happens in the lower part of the belly (abdomen), it is called an inguinal hernia. (It takes its name from a part of the body in this region called the inguinal canal.) A weak spot in the wall of muscles lets some fat or part of the small intestine bulge through. An inguinal hernia can develop at any age. Men get them more often than women. CAUSES  In adults, an inguinal hernia develops over time.  It can be triggered by:  Suddenly straining the muscles of the lower abdomen.  Lifting heavy objects.  Straining to have a bowel movement. Difficult bowel movements (constipation) can lead to this.  Constant coughing. This may be caused by smoking or lung disease.  Being overweight.  Being pregnant.  Working at a job that requires long periods of standing or heavy lifting.  Having had an inguinal hernia  before. One type can be an emergency situation. It is called a strangulated inguinal hernia. It develops if part of the small intestine slips through the weak spot and cannot get back into the abdomen. The blood supply can be cut off. If that happens, part of the intestine may die. This situation requires emergency surgery. SYMPTOMS  Often, a small inguinal hernia has no symptoms. It is found when a healthcare provider does a physical exam. Larger hernias usually have symptoms.   In adults, symptoms may include:  A lump in the groin. This is easier to see when the person is standing. It might disappear when lying down.  In men, a lump in the scrotum.  Pain or burning in the groin. This occurs especially when lifting, straining or coughing.  A dull ache or feeling of pressure in the groin.  Signs of a strangulated hernia can include:  A bulge in the groin that becomes very painful and tender to the touch.  A bulge that turns red or purple.  Fever, nausea and vomiting.  Inability to have a bowel movement or to pass gas. DIAGNOSIS  To decide if you have an inguinal hernia, a healthcare provider will probably do a physical examination.  This will include asking questions about any symptoms you have noticed.  The healthcare provider might feel the groin area and ask you to  cough. If an inguinal hernia is felt, the healthcare provider may try to slide it back into the abdomen.  Usually no other tests are needed. TREATMENT  Treatments can vary. The size of the hernia makes a difference. Options include:  Watchful waiting. This is often suggested if the hernia is small and you have had no symptoms.  No medical procedure will be done unless symptoms develop.  You will need to watch closely for symptoms. If any occur, contact your healthcare provider right away.  Surgery. This is used if the hernia is larger or you have symptoms.  Open surgery. This is usually an outpatient procedure  (you will not stay overnight in a hospital). An cut (incision) is made through the skin in the groin. The hernia is put back inside the abdomen. The weak area in the muscles is then repaired by herniorrhaphy or hernioplasty. Herniorrhaphy: in this type of surgery, the weak muscles are sewn back together. Hernioplasty: a patch or mesh is used to close the weak area in the abdominal wall.  Laparoscopy. In this procedure, a surgeon makes small incisions. A thin tube with a tiny video camera (called a laparoscope) is put into the abdomen. The surgeon repairs the hernia with mesh by looking with the video camera and using two long instruments. HOME CARE INSTRUCTIONS   After surgery to repair an inguinal hernia:  You will need to take pain medicine prescribed by your healthcare provider. Follow all directions carefully.  You will need to take care of the wound from the incision.  Your activity will be restricted for awhile. This will probably include no heavy lifting for several weeks. You also should not do anything too active for a few weeks. When you can return to work will depend on the type of job that you have.  During "watchful waiting" periods, you should:  Maintain a healthy weight.  Eat a diet high in fiber (fruits, vegetables and whole grains).  Drink plenty of fluids to avoid constipation. This means drinking enough water and other liquids to keep your urine clear or pale yellow.  Do not lift heavy objects.  Do not stand for long periods of time.  Quit smoking. This should keep you from developing a frequent cough. SEEK MEDICAL CARE IF:   A bulge develops in your groin area.  You feel pain, a burning sensation or pressure in the groin. This might be worse if you are lifting or straining.  You develop a fever of more than 100.5 F (38.1 C). SEEK IMMEDIATE MEDICAL CARE IF:   Pain in the groin increases suddenly.  A bulge in the groin gets bigger suddenly and does not go  down.  For men, there is sudden pain in the scrotum. Or, the size of the scrotum increases.  A bulge in the groin area becomes red or purple and is painful to touch.  You have nausea or vomiting that does not go away.  You feel your heart beating much faster than normal.  You cannot have a bowel movement or pass gas.  You develop a fever of more than 102.0 F (38.9 C).   This information is not intended to replace advice given to you by your health care provider. Make sure you discuss any questions you have with your health care provider.   Document Released: 05/28/2008 Document Revised: 04/03/2011 Document Reviewed: 07/13/2014 Elsevier Interactive Patient Education [employer].

## 2017-08-17 ENCOUNTER — Other Ambulatory Visit: Payer: Self-pay

## 2017-08-17 ENCOUNTER — Encounter
Admission: RE | Admit: 2017-08-17 | Discharge: 2017-08-17 | Disposition: A | Discharge status: Home / Self Care | Payer: BC Managed Care – PPO | Source: Ambulatory Visit | Attending: Surgery | Admitting: Surgery

## 2017-08-17 DIAGNOSIS — Z01810 Encounter for preprocedural cardiovascular examination: Secondary | ICD-10-CM | POA: Insufficient documentation

## 2017-08-17 DIAGNOSIS — Z01812 Encounter for preprocedural laboratory examination: Secondary | ICD-10-CM | POA: Insufficient documentation

## 2017-08-17 DIAGNOSIS — R001 Bradycardia, unspecified: Secondary | ICD-10-CM | POA: Insufficient documentation

## 2017-08-17 LAB — CBC WITH DIFFERENTIAL/PLATELET
Basophils Absolute: 0 10*3/uL (ref 0–0.1)
Basophils Relative: 1 %
EOS PCT: 2 %
Eosinophils Absolute: 0.1 10*3/uL (ref 0–0.7)
HCT: 40.1 % (ref 40.0–52.0)
Hemoglobin: 13.7 g/dL (ref 13.0–18.0)
LYMPHS ABS: 1.3 10*3/uL (ref 1.0–3.6)
LYMPHS PCT: 22 %
MCH: 28.6 pg (ref 26.0–34.0)
MCHC: 34.1 g/dL (ref 32.0–36.0)
MCV: 83.9 fL (ref 80.0–100.0)
MONO ABS: 0.4 10*3/uL (ref 0.2–1.0)
Monocytes Relative: 6 %
Neutro Abs: 4.1 10*3/uL (ref 1.4–6.5)
Neutrophils Relative %: 69 %
PLATELETS: 239 10*3/uL (ref 150–440)
RBC: 4.78 MIL/uL (ref 4.40–5.90)
RDW: 14.8 % — AB (ref 11.5–14.5)
WBC: 5.9 10*3/uL (ref 3.8–10.6)

## 2017-08-17 LAB — BASIC METABOLIC PANEL
ANION GAP: 6 (ref 5–15)
BUN: 11 mg/dL (ref 8–23)
CALCIUM: 9.3 mg/dL (ref 8.9–10.3)
CO2: 32 mmol/L (ref 22–32)
Chloride: 100 mmol/L (ref 98–111)
Creatinine, Ser: 1.07 mg/dL (ref 0.61–1.24)
GFR calc Af Amer: >60 mL/min (ref >60)
GLUCOSE: 100 mg/dL — AB (ref 70–99)
POTASSIUM: 3.7 mmol/L (ref 3.5–5.1)
Sodium: 138 mmol/L (ref 135–145)

## 2017-08-17 NOTE — Patient Instructions (Signed)
Your procedure is scheduled on: Friday 08/24/17 Report to [place]. To find out your arrival time please call [phone] between 1PM - 3PM on Thursday 08/23/17.  Remember: Instructions that are not followed completely may result in serious medical risk, up to and including death, or upon the discretion of your surgeon and anesthesiologist your surgery may need to be rescheduled.     _X__ 1. Do not eat food after midnight the night before your procedure.                 No gum chewing or hard candies. You may drink clear liquids up to 2 hours                 before you are scheduled to arrive for your surgery- DO not drink clear                 liquids within 2 hours of the start of your surgery.                 Clear Liquids include:  water, apple juice without pulp, clear carbohydrate                 drink such as Clearfast or Gatorade, Black Coffee or Tea (Do not add                 anything to coffee or tea).  __X__2.  On the morning of surgery brush your teeth with toothpaste and water, you                 may rinse your mouth with mouthwash if you wish.  Do not swallow any              toothpaste of mouthwash.     _X__ 3.  No Alcohol for 24 hours before or after surgery.   _X__ 4.  Do Not Smoke or use e-cigarettes For 24 Hours Prior to Your Surgery.                 Do not use any chewable tobacco products for at least 6 hours prior to                 surgery.  ____  5.  Bring all medications with you on the day of surgery if instructed.   __X__  6.  Notify your doctor if there is any change in your medical condition      (cold, fever, infections).     Do not wear jewelry, make-up, hairpins, clips or nail polish. Do not wear lotions, powders, or perfumes.  Do not shave 48 hours prior to surgery. Men may shave face and neck. Do not bring valuables to the hospital.    [facility] is not responsible for any belongings or  valuables.  Contacts, dentures/partials or body piercings may not be worn into surgery. Bring a case for your contacts, glasses or hearing aids, a denture cup will be supplied. Leave your suitcase in the car. After surgery it may be brought to your room. For patients admitted to the hospital, discharge time is determined by your treatment team.   Patients discharged the day of surgery will not be allowed to drive home.   Please read over the following fact sheets that you were given:   MRSA Information  __X__ Take these medicines the morning of surgery with A SIP OF WATER:  1. tamsulosin (FLOMAX)  2.   3.   4.  5.  6.  ____ Fleet Enema (as directed)   __X__ Use CHG Soap/SAGE wipes as directed  ____ Use inhalers on the day of surgery  ____ Stop metformin/Janumet/Farxiga 2 days prior to surgery    ____ Take 1/2 of usual insulin dose the night before surgery. No insulin the morning          of surgery.   ____ Stop Blood Thinners Coumadin/Plavix/Xarelto/Pleta/Pradaxa/Eliquis/Effient/Aspirin  on   Or contact your Surgeon, Cardiologist or Medical Doctor regarding  ability to stop your blood thinners  __X__ Stop Anti-inflammatories 7 days before surgery such as Advil, Ibuprofen, Motrin,  BC or Goodies Powder, Naprosyn, Naproxen, Aleve, Aspirin (stop aspirin today)  IF YOU NEED A PAIN RELIEVER TYLENOL IS OK    __X__ Stop all herbal supplements, fish oil or vitamin E until after surgery.    ____ Bring C-Pap to the hospital.

## 2017-08-21 ENCOUNTER — Telehealth: Payer: Self-pay | Admitting: Surgery

## 2017-08-21 NOTE — Telephone Encounter (Signed)
Patient came by the office and dropped off fmla paperwork payment has been paid, and placed in folder up front.

## 2017-08-23 MED ORDER — CEFAZOLIN SODIUM-DEXTROSE 2-4 GM/100ML-% IV SOLN
2.0000 g | INTRAVENOUS | Status: AC
  Administered 2017-08-24: 2 g via INTRAVENOUS

## 2017-08-24 ENCOUNTER — Encounter
Admission: RE | Disposition: A | Discharge status: Home / Self Care | Payer: Self-pay | Source: Ambulatory Visit | Attending: Surgery

## 2017-08-24 ENCOUNTER — Other Ambulatory Visit: Payer: Self-pay

## 2017-08-24 ENCOUNTER — Ambulatory Visit: Payer: BC Managed Care – PPO | Admitting: Certified Registered Nurse Anesthetist

## 2017-08-24 ENCOUNTER — Ambulatory Visit
Admission: RE | Admit: 2017-08-24 | Discharge: 2017-08-24 | Disposition: A | Discharge status: Home / Self Care | Payer: BC Managed Care – PPO | Source: Ambulatory Visit | Attending: Surgery | Admitting: Surgery

## 2017-08-24 DIAGNOSIS — I10 Essential (primary) hypertension: Secondary | ICD-10-CM | POA: Diagnosis not present

## 2017-08-24 DIAGNOSIS — N529 Male erectile dysfunction, unspecified: Secondary | ICD-10-CM | POA: Diagnosis not present

## 2017-08-24 DIAGNOSIS — F17210 Nicotine dependence, cigarettes, uncomplicated: Secondary | ICD-10-CM | POA: Insufficient documentation

## 2017-08-24 DIAGNOSIS — D649 Anemia, unspecified: Secondary | ICD-10-CM | POA: Insufficient documentation

## 2017-08-24 DIAGNOSIS — Z79899 Other long term (current) drug therapy: Secondary | ICD-10-CM | POA: Insufficient documentation

## 2017-08-24 DIAGNOSIS — K4020 Bilateral inguinal hernia, without obstruction or gangrene, not specified as recurrent: Secondary | ICD-10-CM

## 2017-08-24 HISTORY — PX: INGUINAL HERNIA REPAIR: SHX194

## 2017-08-24 SURGERY — REPAIR, HERNIA, INGUINAL, LAPAROSCOPIC
Anesthesia: General | Laterality: Bilateral | Wound class: Clean

## 2017-08-24 MED ORDER — MIDAZOLAM HCL 2 MG/2ML IJ SOLN
INTRAMUSCULAR | Status: AC
  Filled 2017-08-24: qty 2

## 2017-08-24 MED ORDER — ROCURONIUM BROMIDE 100 MG/10ML IV SOLN
INTRAVENOUS | Status: DC | PRN
  Administered 2017-08-24: 50 mg via INTRAVENOUS

## 2017-08-24 MED ORDER — LIDOCAINE HCL (CARDIAC) PF 100 MG/5ML IV SOSY
PREFILLED_SYRINGE | INTRAVENOUS | Status: DC | PRN
  Administered 2017-08-24: 60 mg via INTRAVENOUS

## 2017-08-24 MED ORDER — PROPOFOL 10 MG/ML IV BOLUS
INTRAVENOUS | Status: AC
  Filled 2017-08-24: qty 20

## 2017-08-24 MED ORDER — FENTANYL CITRATE (PF) 250 MCG/5ML IJ SOLN
INTRAMUSCULAR | Status: AC
  Filled 2017-08-24: qty 5

## 2017-08-24 MED ORDER — HEPARIN SODIUM (PORCINE) 5000 UNIT/ML IJ SOLN
INTRAMUSCULAR | Status: AC
  Filled 2017-08-24: qty 1

## 2017-08-24 MED ORDER — CHLORHEXIDINE GLUCONATE CLOTH 2 % EX PADS: 6.0000 | MEDICATED_PAD | Freq: Once | CUTANEOUS | Status: DC

## 2017-08-24 MED ORDER — KETOROLAC TROMETHAMINE 30 MG/ML IJ SOLN
INTRAMUSCULAR | Status: DC | PRN
  Administered 2017-08-24: 15 mg via INTRAVENOUS

## 2017-08-24 MED ORDER — FAMOTIDINE 20 MG PO TABS
ORAL_TABLET | ORAL | Status: AC
  Filled 2017-08-24: qty 1

## 2017-08-24 MED ORDER — SUGAMMADEX SODIUM 200 MG/2ML IV SOLN
INTRAVENOUS | Status: DC | PRN
  Administered 2017-08-24: 200 mg via INTRAVENOUS

## 2017-08-24 MED ORDER — FENTANYL CITRATE (PF) 100 MCG/2ML IJ SOLN: 25.0000 ug | INTRAMUSCULAR | Status: DC | PRN

## 2017-08-24 MED ORDER — SUGAMMADEX SODIUM 200 MG/2ML IV SOLN
INTRAVENOUS | Status: AC
  Filled 2017-08-24: qty 2

## 2017-08-24 MED ORDER — DEXAMETHASONE SODIUM PHOSPHATE 10 MG/ML IJ SOLN
INTRAMUSCULAR | Status: AC
  Filled 2017-08-24: qty 1

## 2017-08-24 MED ORDER — PROPOFOL 10 MG/ML IV BOLUS
INTRAVENOUS | Status: DC | PRN
  Administered 2017-08-24: 150 mg via INTRAVENOUS

## 2017-08-24 MED ORDER — ROCURONIUM BROMIDE 100 MG/10ML IV SOLN
INTRAVENOUS | Status: AC
  Filled 2017-08-24: qty 1

## 2017-08-24 MED ORDER — LIDOCAINE HCL (PF) 2 % IJ SOLN
INTRAMUSCULAR | Status: AC
  Filled 2017-08-24: qty 10

## 2017-08-24 MED ORDER — MIDAZOLAM HCL 2 MG/2ML IJ SOLN
INTRAMUSCULAR | Status: DC | PRN
  Administered 2017-08-24: 2 mg via INTRAVENOUS

## 2017-08-24 MED ORDER — HYDROCODONE-ACETAMINOPHEN 5-300 MG PO TABS: 1.0000 | ORAL_TABLET | ORAL | 0 refills | Status: DC | PRN

## 2017-08-24 MED ORDER — LACTATED RINGERS IV SOLN
INTRAVENOUS | Status: DC
  Administered 2017-08-24: 13:00:00 via INTRAVENOUS

## 2017-08-24 MED ORDER — CEFAZOLIN SODIUM-DEXTROSE 2-4 GM/100ML-% IV SOLN
INTRAVENOUS | Status: AC
Start: 2017-08-24 — End: 2017-08-24
  Filled 2017-08-24: qty 100

## 2017-08-24 MED ORDER — FENTANYL CITRATE (PF) 100 MCG/2ML IJ SOLN
INTRAMUSCULAR | Status: DC | PRN
  Administered 2017-08-24: 50 ug via INTRAVENOUS
  Administered 2017-08-24: 100 ug via INTRAVENOUS
  Administered 2017-08-24: 50 ug via INTRAVENOUS

## 2017-08-24 MED ORDER — FAMOTIDINE 20 MG PO TABS
20.0000 mg | ORAL_TABLET | Freq: Once | ORAL | Status: AC
  Administered 2017-08-24: 20 mg via ORAL

## 2017-08-24 MED ORDER — EPHEDRINE SULFATE 50 MG/ML IJ SOLN
INTRAMUSCULAR | Status: DC | PRN
  Administered 2017-08-24 (×2): 10 mg via INTRAVENOUS

## 2017-08-24 MED ORDER — GLYCOPYRROLATE 0.2 MG/ML IJ SOLN
INTRAMUSCULAR | Status: DC | PRN
  Administered 2017-08-24: 0.2 mg via INTRAVENOUS

## 2017-08-24 MED ORDER — BUPIVACAINE-EPINEPHRINE (PF) 0.25% -1:200000 IJ SOLN
INTRAMUSCULAR | Status: AC
  Filled 2017-08-24: qty 30

## 2017-08-24 MED ORDER — DEXAMETHASONE SODIUM PHOSPHATE 10 MG/ML IJ SOLN
INTRAMUSCULAR | Status: DC | PRN
  Administered 2017-08-24: 10 mg via INTRAVENOUS

## 2017-08-24 MED ORDER — ONDANSETRON HCL 4 MG/2ML IJ SOLN
INTRAMUSCULAR | Status: AC
  Filled 2017-08-24: qty 2

## 2017-08-24 MED ORDER — ONDANSETRON HCL 4 MG/2ML IJ SOLN: 4.0000 mg | Freq: Once | INTRAMUSCULAR | Status: DC | PRN

## 2017-08-24 MED ORDER — ONDANSETRON HCL 4 MG/2ML IJ SOLN
INTRAMUSCULAR | Status: DC | PRN
  Administered 2017-08-24: 4 mg via INTRAVENOUS

## 2017-08-24 MED ORDER — HEPARIN SODIUM (PORCINE) 5000 UNIT/ML IJ SOLN
5000.0000 [IU] | Freq: Once | INTRAMUSCULAR | Status: AC
  Administered 2017-08-24: 5000 [IU] via SUBCUTANEOUS

## 2017-08-24 MED ORDER — BUPIVACAINE-EPINEPHRINE (PF) 0.25% -1:200000 IJ SOLN
INTRAMUSCULAR | Status: DC | PRN
  Administered 2017-08-24: 30 mL via PERINEURAL

## 2017-08-24 SURGICAL SUPPLY — 35 items
ADHESIVE MASTISOL STRL (MISCELLANEOUS) ×3 IMPLANT
BLADE CLIPPER SURG (BLADE) ×3 IMPLANT
BLADE SURG SZ11 CARB STEEL (BLADE) ×3 IMPLANT
CHLORAPREP W/TINT 26ML (MISCELLANEOUS) ×3 IMPLANT
CLOSURE WOUND 1/2 X4 (GAUZE/BANDAGES/DRESSINGS) ×1
DISSECT BALLN SPACEMKR OVL PDB (BALLOONS)
DISSECT BALLN SPACEMKR RND PDB (MISCELLANEOUS) ×3
DISSECTOR BALLN SPCMKR OVL PDB (BALLOONS) IMPLANT
DISSECTOR BALLN SPCMKR RND PDB (MISCELLANEOUS) ×1 IMPLANT
GLOVE BIO SURGEON STRL SZ8 (GLOVE) ×3 IMPLANT
GOWN STRL REUS W/ TWL LRG LVL3 (GOWN DISPOSABLE) ×2 IMPLANT
GOWN STRL REUS W/TWL LRG LVL3 (GOWN DISPOSABLE) ×4
IRRIGATION STRYKERFLOW (MISCELLANEOUS) IMPLANT
IRRIGATOR STRYKERFLOW (MISCELLANEOUS)
LABEL OR SOLS (LABEL) ×3 IMPLANT
MESH HERNIA 4X6 PROLITE RECT (Mesh General) ×2 IMPLANT
MESH POLY 4X6 (Mesh General) ×4 IMPLANT
NEEDLE HYPO 22GX1.5 SAFETY (NEEDLE) ×3 IMPLANT
NS IRRIG 500ML POUR BTL (IV SOLUTION) ×3 IMPLANT
PACK LAP CHOLECYSTECTOMY (MISCELLANEOUS) ×3 IMPLANT
SPONGE GAUZE 2X2 8PLY STER LF (GAUZE/BANDAGES/DRESSINGS) ×3
SPONGE GAUZE 2X2 8PLY STRL LF (GAUZE/BANDAGES/DRESSINGS) ×6 IMPLANT
SPONGE LAP 18X18 RF (DISPOSABLE) ×3 IMPLANT
STRIP CLOSURE SKIN 1/2X4 (GAUZE/BANDAGES/DRESSINGS) ×2 IMPLANT
SURGILUBE 2OZ TUBE FLIPTOP (MISCELLANEOUS) ×3 IMPLANT
SUT ETHIBOND 0 (SUTURE) IMPLANT
SUT MNCRL 4-0 (SUTURE) ×4
SUT MNCRL 4-0 27XMFL (SUTURE) ×2
SUT VICRYL 0 AB UR-6 (SUTURE) ×3 IMPLANT
SUTURE MNCRL 4-0 27XMF (SUTURE) ×2 IMPLANT
TACKER 5MM HERNIA 3.5CML NAB (ENDOMECHANICALS) ×3 IMPLANT
TRAY FOLEY MTR SLVR 16FR STAT (SET/KITS/TRAYS/PACK) ×3 IMPLANT
TROCAR 5MM SINGLE VERSAONE (TROCAR) ×6 IMPLANT
TROCAR BALLN 10M OMST10SB SPAC (TROCAR) IMPLANT
TUBING INSUFFLATION (TUBING) ×3 IMPLANT

## 2017-08-24 NOTE — Anesthesia Post-op Follow-up Note (Signed)
Anesthesia QCDR form completed.        

## 2017-08-24 NOTE — Progress Notes (Signed)
Preoperative Review   Patient is met in the preoperative holding area. The history is reviewed in the chart and with the patient. I personally reviewed the options and rationale as well as the risks of this procedure that have been previously discussed with the patient. All questions asked by the patient and/or family were answered to their satisfaction.  Patient agrees to proceed with this procedure at this time.  Tomer Chalmers E Kathleen Likins M.D. FACS  

## 2017-08-24 NOTE — Discharge Instructions (Signed)
Remove dressing in 24 hours. °May shower in 24 hours. °Leave paper strips in place. °Resume all home medications. °Follow-up with Dr. Kyden Potash in 10 days. °

## 2017-08-24 NOTE — Transfer of Care (Signed)
Immediate Anesthesia Transfer of Care Note  Patient: Dale Palmer  Procedure(s) Performed: LAPAROSCOPIC INGUINAL HERNIA, bilateral (Bilateral )  Patient Location: PACU  Anesthesia Type:General  Level of Consciousness: sedated  Airway & Oxygen Therapy: Patient Spontanous Breathing and Patient connected to nasal cannula oxygen  Post-op Assessment: Report given to RN and Post -op Vital signs reviewed and stable  Post vital signs: Reviewed and stable  Last Vitals:  Vitals Value Taken Time  BP 145/75 08/24/2017  3:34 PM  Temp 36.7 C 08/24/2017  3:34 PM  Pulse 75 08/24/2017  3:34 PM  Resp 12 08/24/2017  3:34 PM  SpO2 100 % 08/24/2017  3:34 PM  Vitals shown include unvalidated device data.  Last Pain:  Vitals:   08/24/17 1534  TempSrc: Temporal  PainSc:          Complications: No apparent anesthesia complications

## 2017-08-24 NOTE — Anesthesia Postprocedure Evaluation (Signed)
Anesthesia Post Note  Patient: Dale Palmer  Procedure(s) Performed: LAPAROSCOPIC INGUINAL HERNIA, bilateral (Bilateral )  Patient location during evaluation: PACU Anesthesia Type: General Level of consciousness: awake and alert Pain management: pain level controlled Vital Signs Assessment: post-procedure vital signs reviewed and stable Respiratory status: spontaneous breathing, nonlabored ventilation, respiratory function stable and patient connected to nasal cannula oxygen Cardiovascular status: blood pressure returned to baseline and stable Postop Assessment: no apparent nausea or vomiting Anesthetic complications: no     Last Vitals:  Vitals:   08/24/17 1603 08/24/17 1612  BP: 118/87 137/84  Pulse:  73  Resp:  16  Temp: 36.6 C (!) 36.4 C  SpO2:  98%    Last Pain:  Vitals:   08/24/17 1612  TempSrc: Tympanic  PainSc: 0-No pain                 Oliviya Gilkison S

## 2017-08-24 NOTE — Anesthesia Procedure Notes (Signed)
Procedure Name: Intubation Date/Time: 08/24/2017 2:10 PM Performed by: Eben Burow, CRNA Pre-anesthesia Checklist: Patient identified, Emergency Drugs available, Suction available, Patient being monitored and Timeout performed Patient Re-evaluated:Patient Re-evaluated prior to induction Oxygen Delivery Method: Circle system utilized Preoxygenation: Pre-oxygenation with 100% oxygen Induction Type: IV induction Ventilation: Mask ventilation without difficulty Laryngoscope Size: Mac and 4 Grade View: Grade II Tube type: Oral Tube size: 7.5 mm Number of attempts: 2 (unable to visualize glottis with Miller 2; Mac 4 per Dr. Marcello Moores with success) Airway Equipment and Method: Stylet Placement Confirmation: ETT inserted through vocal cords under direct vision,  positive ETCO2 and breath sounds checked- equal and bilateral Secured at: 22 cm Tube secured with: Tape Dental Injury: Teeth and Oropharynx as per pre-operative assessment

## 2017-08-24 NOTE — Anesthesia Preprocedure Evaluation (Addendum)
Anesthesia Evaluation  Patient identified by MRN, date of birth, ID band Patient awake    Reviewed: Allergy & Precautions, NPO status , Patient's Chart, lab work & pertinent test results, reviewed documented beta blocker date and time   Airway Mallampati: III  TM Distance: >3 FB     Dental  (+) Chipped, Upper Dentures, Partial Lower   Pulmonary Current Smoker,           Cardiovascular hypertension, Pt. on medications      Neuro/Psych    GI/Hepatic   Endo/Other    Renal/GU      Musculoskeletal   Abdominal   Peds  Hematology  (+) anemia ,   Anesthesia Other Findings Smokes.  Reproductive/Obstetrics                            Anesthesia Physical Anesthesia Plan  ASA: III  Anesthesia Plan: General   Post-op Pain Management:    Induction: Intravenous  PONV Risk Score and Plan:   Airway Management Planned: Oral ETT  Additional Equipment:   Intra-op Plan:   Post-operative Plan:   Informed Consent: I have reviewed the patients History and Physical, chart, labs and discussed the procedure including the risks, benefits and alternatives for the proposed anesthesia with the patient or authorized representative who has indicated his/her understanding and acceptance.     Plan Discussed with: CRNA  Anesthesia Plan Comments:         Anesthesia Quick Evaluation

## 2017-08-24 NOTE — Op Note (Signed)
Laparoscopic Inguinal Hernia Repair  Dale Palmer  08/24/2017  Pre-operative Diagnosis: Bilateral Inguinal Hernia  Post-operative Diagnosis: Bilateral inguinal hernia  Procedure: Laparoscopic preperitoneal repair of bilateral inguinal hernia   Surgeon: Jerrol Banana. Burt Knack, MD FACS  Anesthesia: Gen. with endotracheal tube  Assistant: Surgical tech  Procedure Details  The patient was seen again in the Holding Room. The benefits, complications, treatment options, and expected outcomes were discussed with the patient. The risks of bleeding, infection, recurrence of symptoms, failure to resolve symptoms, recurrence of hernia, ischemic orchitis, chronic pain syndrome or neuroma, were discussed again. The likelihood of improving the patient's symptoms with return to their baseline status is good.  The patient and/or family concurred with the proposed plan, giving informed consent.  The patient was taken to Operating Room, identified as Dale Palmer and the procedure verified as Laparoscopic Inguinal Hernia Repair. Laterality confirmed.  A Time Out was held and the above information confirmed.  Prior to the induction of general anesthesia, antibiotic prophylaxis was administered. VTE prophylaxis was in place. General endotracheal anesthesia was then administered and tolerated well. A Foley catheter was placed by the nursing staff. After the induction, the abdomen was prepped with Chloraprep and draped in the sterile fashion. The patient was positioned in the supine position.  Local anesthetic  was injected into the skin near the umbilicus and an incision made. An incision was made and dissection down to the rectus fascia was performed. The fascia was incised and the muscle retracted laterally. The Covidien dissecting balloon was placed followed by the structural balloon. The preperitoneal space was insufflated and under direct vision 2 midline 5 mm ports were placed.  Dissection was  performed to delineate Dale Palmer's ligament and the lateral extent of dissection was determined. The nerve on the lateral abdominal wall was identified and kept in view at all times. The cord was skeletonized of the indirect sac and cord lipoma which was retracted cephalad.  On the right there was a large direct space hernia with a portion of what appeared to be bladder within that hernia.  The bladder was gently teased out of the hernia and retracted.  On the left was a large indirect sac large cord lipoma and weak floor with small direct space hernia.  This was dissected all in a similar manner.  Once this was complete, a laterally scissored Atrium mesh was placed into the preperitoneal space. It was held in place with the Covidien tacking device avoiding the area of the nerve.  This was done on each side adequately covering the direct space hernias.  Once assuring that the hernia was completely repaired and adequately covered, the preperitoneal space was desufflated under direct vision. There was no sign of peritoneal rent and no sign of bowel intrusion towards the mesh.  Once assuring that hemostasis was adequate the ports were removed and a figure-of-eight 0 Vicryl suture was placed at the fascial edges. 4-0 subcuticular Monocryl was used at all skin edges. Steri-Strips and Mastisol and sterile dressings were placed.  Patient tolerated the procedure well. There were no complications. He was taken to the recovery room in stable condition to be discharged to the care of his family and follow-up in 10 days.    Findings: Bilateral inguinal hernias.  On the right was a large direct space hernia with a portion of bladder within the hernia.  A large indirect sac was identified with cord lipoma as well.  On the left was a large indirect sac and  small direct space hernia.                    Dale Palmer E. Burt Knack, MD, FACS

## 2017-08-27 ENCOUNTER — Telehealth: Payer: Self-pay

## 2017-08-27 NOTE — Telephone Encounter (Signed)
FMLA paperwork faxed to [facility] at this time [phone].  Patient notified. Copy made and placed in scan folder.

## 2017-09-10 ENCOUNTER — Encounter: Payer: Self-pay | Admitting: Surgery

## 2017-09-10 ENCOUNTER — Ambulatory Visit (INDEPENDENT_AMBULATORY_CARE_PROVIDER_SITE_OTHER): Payer: BC Managed Care – PPO | Admitting: Surgery

## 2017-09-10 VITALS — BP 129/87 | HR 84 | Temp 97.5°F | Ht 66.0 in | Wt 185.6 lb

## 2017-09-10 DIAGNOSIS — K4020 Bilateral inguinal hernia, without obstruction or gangrene, not specified as recurrent: Secondary | ICD-10-CM

## 2017-09-10 NOTE — Patient Instructions (Addendum)
    Please call office with questions or concerns.  GENERAL POST-OPERATIVE PATIENT INSTRUCTIONS   WOUND CARE INSTRUCTIONS:  Keep a dry clean dressing on the wound if there is drainage. The initial bandage may be removed after 24 hours.  Once the wound has quit draining you may leave it open to air.  If clothing rubs against the wound or causes irritation and the wound is not draining you may cover it with a dry dressing during the daytime.  Try to keep the wound dry and avoid ointments on the wound unless directed to do so.  If the wound becomes bright red and painful or starts to drain infected material that is not clear, please contact your physician immediately.  If the wound is mildly pink and has a thick firm ridge underneath it, this is normal, and is referred to as a healing ridge.  This will resolve over the next 4-6 weeks.  BATHING: You may shower if you have been informed of this by your surgeon. However, Please do not submerge in a tub, hot tub, or pool until incisions are completely sealed or have been told by your surgeon that you may do so.  DIET:  You may eat any foods that you can tolerate.  It is a good idea to eat a high fiber diet and take in plenty of fluids to prevent constipation.  If you do become constipated you may want to take a mild laxative or take ducolax tablets on a daily basis until your bowel habits are regular.  Constipation can be very uncomfortable, along with straining, after recent surgery.  ACTIVITY:  You are encouraged to cough and deep breath or use your incentive spirometer if you were given one, every 15-30 minutes when awake.  This will help prevent respiratory complications and low grade fevers post-operatively if you had a general anesthetic.  You may want to hug a pillow when coughing and sneezing to add additional support to the surgical area, if you had abdominal or chest surgery, which will decrease pain during these times.  You are encouraged to walk  and engage in light activity for the next two weeks.  You should not lift more than 20 pounds, until 10/05/2017 as it could put you at increased risk for complications.  Twenty pounds is roughly equivalent to a plastic bag of groceries. At that time- Listen to your body when lifting, if you have pain when lifting, stop and then try again in a few days. Soreness after doing exercises or activities of daily living is normal as you get back in to your normal routine.  MEDICATIONS:  Try to take narcotic medications and anti-inflammatory medications, such as tylenol, ibuprofen, naprosyn, etc., with food.  This will minimize stomach upset from the medication.  Should you develop nausea and vomiting from the pain medication, or develop a rash, please discontinue the medication and contact your physician.  You should not drive, make important decisions, or operate machinery when taking narcotic pain medication.  SUNBLOCK Use sun block to incision area over the next year if this area will be exposed to sun. This helps decrease scarring and will allow you avoid a permanent darkened area over your incision.  QUESTIONS:  Please feel free to call our office if you have any questions, and we will be glad to assist you. [phone]

## 2017-09-10 NOTE — Progress Notes (Signed)
Outpatient postop visit  09/10/2017  Dale Palmer is an 67 y.o. male.    Procedure: Lap BIH CC:No problems  HPI: feels well  Medications reviewed.    Physical Exam:  BP 129/87   Pulse 84   Temp (!) 97.5 F (36.4 C) (Temporal)   Ht 5\' 6"  (1.676 m)   Wt 185 lb 9.6 oz (84.2 kg)   BMI 29.96 kg/m     PE: wounds clean no erythema nontender    Assessment/Plan:  Doing well F/u as needed  Florene Glen, MD, FACS

## 2017-09-12 ENCOUNTER — Telehealth: Payer: Self-pay

## 2017-09-12 NOTE — Telephone Encounter (Signed)
FMLA paperwork completed and faxed. Patient return to work date 09/17/17 with restrictions until 10/05/17.

## 2017-09-20 ENCOUNTER — Telehealth: Payer: Self-pay | Admitting: Surgery

## 2017-09-20 NOTE — Telephone Encounter (Signed)
Dale Palmer called stating her husband's FMLA paperwork need to completed with the type of surgery he had done. The papers were  already sent into the company. She is asking this be corrected & re-faxed to the company. Please let her know this was done and if you have any questions [phone]).   Thank You,  Threasa Beards

## 2017-09-20 NOTE — Telephone Encounter (Signed)
Patient's wife, Tamela Oddi was called and told her that I had fixed her husband's FMLA forms were revised and faxed to his job. Mrs. Doris had no further questions.

## 2017-09-24 ENCOUNTER — Telehealth: Payer: Self-pay

## 2017-09-24 NOTE — Telephone Encounter (Signed)
Call pt regarding lung screening per pt doesn't think he needs to get another done. Wishes to declined at this time.

## 2017-09-25 ENCOUNTER — Telehealth: Payer: Self-pay | Admitting: *Deleted

## 2017-09-25 DIAGNOSIS — Z122 Encounter for screening for malignant neoplasm of respiratory organs: Secondary | ICD-10-CM

## 2017-09-25 NOTE — Telephone Encounter (Signed)
Patient has been notified that the annual lung cancer screening low dose CT scan is due currently or will be in the near future.  Confirmed that the patient is within the age range of 86-80, and asymptomatic, and currently exhibits no signs or symptoms of lung cancer.  Patient denies illness that would prevent curative treatment for lung cancer if found.  Verified smoking history, currently smoking but has decreased to 1//4 pack per day, 33 pkyr history.  The shared decision making visit was completed on 10-11-16.  Patient is agreeable for the CT scan to be scheduled.  Will call patient back with date and time of appointment.

## 2017-10-01 ENCOUNTER — Telehealth: Payer: Self-pay | Admitting: *Deleted

## 2017-10-01 NOTE — Telephone Encounter (Signed)
Called to inform patient of appointment for LDCT screening on Monday 10/15/2017 @ 10:35am, no answer at number listed, appointment mailed to patient.

## 2017-10-15 ENCOUNTER — Ambulatory Visit
Admission: RE | Admit: 2017-10-15 | Discharge: 2017-10-15 | Disposition: A | Discharge status: Home / Self Care | Payer: BC Managed Care – PPO | Source: Ambulatory Visit | Attending: Oncology | Admitting: Oncology

## 2017-10-15 DIAGNOSIS — F17210 Nicotine dependence, cigarettes, uncomplicated: Secondary | ICD-10-CM | POA: Insufficient documentation

## 2017-10-15 DIAGNOSIS — I2510 Atherosclerotic heart disease of native coronary artery without angina pectoris: Secondary | ICD-10-CM | POA: Insufficient documentation

## 2017-10-15 DIAGNOSIS — Z122 Encounter for screening for malignant neoplasm of respiratory organs: Secondary | ICD-10-CM

## 2017-10-15 DIAGNOSIS — J439 Emphysema, unspecified: Secondary | ICD-10-CM | POA: Insufficient documentation

## 2017-10-15 DIAGNOSIS — I700 Atherosclerosis of aorta: Secondary | ICD-10-CM | POA: Insufficient documentation

## 2017-10-16 ENCOUNTER — Encounter: Payer: Self-pay | Admitting: *Deleted

## 2017-11-06 ENCOUNTER — Encounter: Payer: Self-pay | Admitting: Urology

## 2017-11-06 ENCOUNTER — Ambulatory Visit: Payer: BC Managed Care – PPO | Admitting: Urology

## 2017-11-06 VITALS — BP 122/79 | HR 64 | Ht 67.0 in | Wt 187.0 lb

## 2017-11-06 DIAGNOSIS — N401 Enlarged prostate with lower urinary tract symptoms: Secondary | ICD-10-CM

## 2017-11-06 DIAGNOSIS — R3915 Urgency of urination: Secondary | ICD-10-CM | POA: Diagnosis not present

## 2017-11-06 DIAGNOSIS — Z87898 Personal history of other specified conditions: Secondary | ICD-10-CM

## 2017-11-06 DIAGNOSIS — R3129 Other microscopic hematuria: Secondary | ICD-10-CM | POA: Diagnosis not present

## 2017-11-06 LAB — URINALYSIS, COMPLETE
Bilirubin, UA: NEGATIVE
Glucose, UA: NEGATIVE
Ketones, UA: NEGATIVE
Leukocytes, UA: NEGATIVE
NITRITE UA: NEGATIVE
PH UA: 7 (ref 5.0–7.5)
Protein, UA: NEGATIVE
Specific Gravity, UA: 1.015 (ref 1.005–1.030)
UUROB: 0.2 mg/dL (ref 0.2–1.0)

## 2017-11-06 LAB — MICROSCOPIC EXAMINATION
EPITHELIAL CELLS (NON RENAL): NONE SEEN /HPF (ref 0–10)
WBC UA: NONE SEEN /HPF (ref 0–5)

## 2017-11-06 LAB — BLADDER SCAN AMB NON-IMAGING

## 2017-11-06 NOTE — Progress Notes (Signed)
11/06/2017 1:41 PM   Dale Palmer 09-30-1950 [id number]  Referring provider: Leonel Ramsay, MD Dale Palmer, Dale Palmer [id number]  Chief Complaint  Patient presents with  . 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  He reports today that he is doing much better.  His urinary symptoms are now well controlled on Flomax and following hernia repair.  He reports that he is able to empty his bladder and has significantly decreased urinary urgency frequency.  IPSS as below.  He is very pleased with the outcome.  He has a personal history of microscopic hematuria status post hematuria work-up earlier this year including cystoscopy.   He also has a personal history of fluctuating PSA.  PSA as below.  Prostate volume on CT scan 79 g.  Notably, there is some slightly asymmetric enhancement on the right posterior medial peripheral zone of the mid gland and apex.    PSA trend: 3.68 on 03/2017 4.38 on 08/2016 3.55 on 02/2016 5.95 on 08/2015 3.27 on 08/2013  IPSS    Row Name 11/06/17 1300         International Prostate Symptom Score   How often have you had the sensation of not emptying your bladder?  Less than half the time     How often have you had to urinate less than every two hours?  About half the time     How often have you found you stopped and started again several times when you urinated?  Not at All     How often have you found it difficult to postpone urination?  Not at All     How often have you had a weak urinary stream?  Less than 1 in 5 times     How often have you had to strain to start urination?  Not at All     How many times did you typically get up at night to urinate?  None     Total IPSS Score  6       Quality of Life due to urinary  symptoms   If you were to spend the rest of your life with your urinary condition just the way it is now how would you feel about that?  Mixed        Score:  1-7 Mild 8-19 Moderate 20-35 Severe   PMH: Past Medical History:  Diagnosis Date  . Anemia   . B12 deficiency   . ED (erectile dysfunction) 07/12/2017  . Elevated PSA, less than 10 ng/ml 09/22/2015  . Erectile dysfunction   . Hypertension   . Tobacco abuse 10/11/2016    Surgical History: Past Surgical History:  Procedure Laterality Date  . INGUINAL HERNIA REPAIR Bilateral 08/24/2017   Procedure: LAPAROSCOPIC INGUINAL HERNIA, bilateral;  Surgeon: Florene Glen, MD;  Location: [facility] ORS;  Service: General;  Laterality: Bilateral;  . none      Home Medications:  Allergies as of 11/06/2017   No Known Allergies     Medication List        Accurate as of 11/06/17  1:41 PM. Always use your most recent med list.          aspirin EC 81 MG tablet TAKE 1 TABLET (81 MG TOTAL) BY  MOUTH ONCE DAILY.   lisinopril-hydrochlorothiazide 20-25 MG tablet Commonly known as:  PRINZIDE,ZESTORETIC Take 1 tablet by mouth daily.   tamsulosin 0.4 MG Caps capsule Commonly known as:  FLOMAX TAKE 1 CAPSULE (0.4 MG TOTAL) BY MOUTH ONCE DAILY. TAKE 30 MINUTES AFTER SAME MEAL EACH DAY.   VITAMIN B-12 PO Take 1 tablet by mouth daily.       Allergies: No Known Allergies  Family History: Family History  Problem Relation Age of Onset  . Prostate cancer Neg Hx   . Kidney cancer Neg Hx     Social History:  reports that he has been smoking. He has a 22.00 pack-year smoking history. He has never used smokeless tobacco. He reports that he drinks alcohol. He reports that he does not use drugs.  ROS: UROLOGY Frequent Urination?: No Hard to postpone urination?: No Burning/pain with urination?: No Get up at night to urinate?: No Leakage of urine?: No Urine stream starts and stops?: No Trouble starting stream?: No Do you have to  strain to urinate?: No Blood in urine?: Yes Urinary tract infection?: No Sexually transmitted disease?: No Injury to kidneys or bladder?: No Painful intercourse?: No Weak stream?: No Erection problems?: No Penile pain?: No  Gastrointestinal Nausea?: No Vomiting?: No Indigestion/heartburn?: No Diarrhea?: No Constipation?: No  Constitutional Fever: No Night sweats?: No Weight loss?: No Fatigue?: No  Skin Skin rash/lesions?: No Itching?: No  Eyes Blurred vision?: No Double vision?: No  Ears/Nose/Throat Sore throat?: No Sinus problems?: No  Hematologic/Lymphatic Swollen glands?: No Easy bruising?: No  Cardiovascular Leg swelling?: No Chest pain?: No  Respiratory Cough?: No Shortness of breath?: No  Endocrine Excessive thirst?: No  Musculoskeletal Back pain?: No Joint pain?: No  Neurological Headaches?: No Dizziness?: No  Psychologic Depression?: No Anxiety?: No  Physical Exam: BP 122/79   Pulse 64   Ht 5\' 7"  (1.702 m)   Wt 187 lb (84.8 kg)   BMI 29.29 kg/m   Constitutional:  Alert and oriented, No acute distress. HEENT: [place] AT, moist mucus membranes.  Trachea midline, no masses. Cardiovascular: No clubbing, cyanosis, or edema. Respiratory: Normal respiratory effort, no increased work of breathing. Skin: No rashes, bruises or suspicious lesions. Neurologic: Grossly intact, no focal deficits, moving all 4 extremities. Psychiatric: Normal mood and affect.  Laboratory Data: Lab Results  Component Value Date   WBC 5.9 08/17/2017   HGB 13.7 08/17/2017   HCT 40.1 08/17/2017   MCV 83.9 08/17/2017   PLT 239 08/17/2017    Lab Results  Component Value Date   CREATININE 1.07 08/17/2017   PSA as above  Urinalysis    Component Value Date/Time   APPEARANCEUR Clear 11/06/2017 1305   GLUCOSEU Negative 11/06/2017 1305   BILIRUBINUR Negative 11/06/2017 1305   PROTEINUR Negative 11/06/2017 1305   NITRITE Negative 11/06/2017 1305    LEUKOCYTESUR Negative 11/06/2017 1305    Lab Results  Component Value Date   LABMICR See below: 11/06/2017   WBCUA None seen 11/06/2017   RBCUA 3-10 (A) 11/06/2017   LABEPIT None seen 11/06/2017   MUCUS Present (A) 11/06/2017   BACTERIA Few (A) 11/06/2017    Pertinent Imaging: Results for orders placed or performed in visit on 11/06/17  Microscopic Examination  Result Value Ref Range   WBC, UA None seen 0 - 5 /hpf   RBC, UA 3-10 (A) 0 - 2 /hpf   Epithelial Cells (non renal) None seen 0 - 10 /hpf   Mucus, UA Present (A) Not Estab.  Bacteria, UA Few (A) None seen/Few  Urinalysis, Complete  Result Value Ref Range   Specific Gravity, UA 1.015 1.005 - 1.030   pH, UA 7.0 5.0 - 7.5   Color, UA Yellow Yellow   Appearance Ur Clear Clear   Leukocytes, UA Negative Negative   Protein, UA Negative Negative/Trace   Glucose, UA Negative Negative   Ketones, UA Negative Negative   RBC, UA 1+ (A) Negative   Bilirubin, UA Negative Negative   Urobilinogen, Ur 0.2 0.2 - 1.0 mg/dL   Nitrite, UA Negative Negative   Microscopic Examination See below:   BLADDER SCAN AMB NON-IMAGING  Result Value Ref Range   Scan Result 64ml     Assessment & Plan:    1. Microscopic hematuria Persistent microscopic hematuria Status post negative evaluation earlier this year We will continue to follow and consider repeat cystoscopy/imaging in 1 to 2 years given his smoking history - Urinalysis, Complete - BLADDER SCAN AMB NON-IMAGING  2. History of elevated PSA History of borderline elevated and fluctuating PSA Recommend repeat PSA in 03/2018 with rectal exam - PSA; Future  3. Benign prostatic hyperplasia (BPH) with urinary urgency Symptomatically improved status post hernia repair Suspect his symptoms are primarily related to herniation of the bladder rather than prostatic obstruction Continue Flomax   Return in about 6 months (around 05/08/2018) for PSA prior .  Hollice Espy, MD  [facility]  [facility] [address] [place], [place] [address] [phone]

## 2018-05-09 ENCOUNTER — Other Ambulatory Visit: Payer: BC Managed Care – PPO

## 2018-05-14 ENCOUNTER — Ambulatory Visit: Payer: BC Managed Care – PPO | Admitting: Urology

## 2018-09-04 ENCOUNTER — Other Ambulatory Visit: Payer: Self-pay

## 2018-09-04 ENCOUNTER — Other Ambulatory Visit: Payer: BC Managed Care – PPO

## 2018-09-04 DIAGNOSIS — Z87898 Personal history of other specified conditions: Secondary | ICD-10-CM

## 2018-09-05 LAB — PSA: Prostate Specific Ag, Serum: 5.5 ng/mL — ABNORMAL HIGH (ref 0.0–4.0)

## 2018-09-11 ENCOUNTER — Encounter: Payer: Self-pay | Admitting: Urology

## 2018-09-11 ENCOUNTER — Ambulatory Visit (INDEPENDENT_AMBULATORY_CARE_PROVIDER_SITE_OTHER): Payer: BC Managed Care – PPO | Admitting: Urology

## 2018-09-11 ENCOUNTER — Other Ambulatory Visit: Payer: Self-pay

## 2018-09-11 VITALS — BP 142/83 | HR 71 | Ht 65.0 in | Wt 177.0 lb

## 2018-09-11 DIAGNOSIS — Z87448 Personal history of other diseases of urinary system: Secondary | ICD-10-CM | POA: Diagnosis not present

## 2018-09-11 DIAGNOSIS — N138 Other obstructive and reflux uropathy: Secondary | ICD-10-CM

## 2018-09-11 DIAGNOSIS — N401 Enlarged prostate with lower urinary tract symptoms: Secondary | ICD-10-CM | POA: Diagnosis not present

## 2018-09-11 DIAGNOSIS — Z87898 Personal history of other specified conditions: Secondary | ICD-10-CM | POA: Diagnosis not present

## 2018-09-11 NOTE — Progress Notes (Signed)
09/11/2018 11:30 AM   Dale Palmer 1950-04-04 [id number]  Referring provider: Baxter Hire, MD [place],   [id number]  Chief Complaint  Patient presents with  . Elevated PSA    50month w/PSA    HPI: 68 year old male who initially presented 03/2017 with microscopic hematuria for evaluation and questionable irregular prostate on CT scan.  He returns today for follow-up.   On CT urogram, he was noted to have significant prostamegaly with some asymmetric enhancement as below within his prostate as well as a bladder herniated into the inguinal hernia.  He underwent repair of his inguinal hernia and his urinary symptoms resolved.  Today, he remains relatively well controlled on Flomax only without any significant urinary symptoms.  No dysuria or gross hematuria.  He also completed a CT hematuria as well as cystoscopy which was unremarkable.  He also has a personal history of fluctuating PSA.  PSA as below.  Prostate volume on CT scan 79 g.  Notably, there is some slightly asymmetric enhancement on the right posterior medial peripheral zone of the mid gland and apex.    PSA trend: 5.5  On 8122020 3.68 on 03/2017 4.38 on 08/2016 3.55 on 02/2016 5.95 on 08/2015 3.27 on 08/2013    PMH: Past Medical History:  Diagnosis Date  . Anemia   . B12 deficiency   . ED (erectile dysfunction) 07/12/2017  . Elevated PSA, less than 10 ng/ml 09/22/2015  . Erectile dysfunction   . Hypertension   . Tobacco abuse 10/11/2016    Surgical History: Past Surgical History:  Procedure Laterality Date  . INGUINAL HERNIA REPAIR Bilateral 08/24/2017   Procedure: LAPAROSCOPIC INGUINAL HERNIA, bilateral;  Surgeon: Florene Glen, MD;  Location: [facility] ORS;  Service: General;  Laterality: Bilateral;  . none      Home Medications:  Allergies as of 09/11/2018   No Known Allergies     Medication List       Accurate as of September 11, 2018 11:30 AM. If you have any  questions, ask your nurse or doctor.        aspirin EC 81 MG tablet TAKE 1 TABLET (81 MG TOTAL) BY MOUTH ONCE DAILY.   lisinopril-hydrochlorothiazide 20-25 MG tablet Commonly known as: ZESTORETIC Take 1 tablet by mouth daily.   tamsulosin 0.4 MG Caps capsule Commonly known as: FLOMAX TAKE 1 CAPSULE (0.4 MG TOTAL) BY MOUTH ONCE DAILY. TAKE 30 MINUTES AFTER SAME MEAL EACH DAY.   VITAMIN B-12 PO Take 1 tablet by mouth daily.       Allergies: No Known Allergies  Family History: Family History  Problem Relation Age of Onset  . Prostate cancer Neg Hx   . Kidney cancer Neg Hx     Social History:  reports that he has been smoking. He has a 22.00 pack-year smoking history. He has never used smokeless tobacco. He reports current alcohol use. He reports that he does not use drugs.  ROS: UROLOGY Frequent Urination?: No Hard to postpone urination?: No Burning/pain with urination?: No Get up at night to urinate?: No Leakage of urine?: No Urine stream starts and stops?: No Trouble starting stream?: No Do you have to strain to urinate?: No Blood in urine?: No Urinary tract infection?: No Sexually transmitted disease?: No Injury to kidneys or bladder?: No Painful intercourse?: No Weak stream?: No Erection problems?: No Penile pain?: No  Gastrointestinal Nausea?: No Vomiting?: No Indigestion/heartburn?: No Diarrhea?: No Constipation?: No  Constitutional Fever: No Night sweats?:  No Weight loss?: No Fatigue?: No  Skin Skin rash/lesions?: No Itching?: No  Eyes Blurred vision?: No Double vision?: No  Ears/Nose/Throat Sore throat?: No Sinus problems?: No  Hematologic/Lymphatic Swollen glands?: No Easy bruising?: No  Cardiovascular Leg swelling?: No Chest pain?: No  Respiratory Cough?: No Shortness of breath?: No  Endocrine Excessive thirst?: No  Musculoskeletal Back pain?: No Joint pain?: No  Neurological Headaches?: No Dizziness?: No   Psychologic Depression?: No Anxiety?: No  Physical Exam: BP (!) 142/83   Pulse 71   Ht 5\' 5"  (1.651 m)   Wt 177 lb (80.3 kg)   BMI 29.45 kg/m   Constitutional:  Alert and oriented, No acute distress. HEENT: [place] AT, moist mucus membranes.  Trachea midline, no masses. Cardiovascular: No clubbing, cyanosis, or edema. Respiratory: Normal respiratory effort, no increased work of breathing. GI: Abdomen is soft, nontender, nondistended, no abdominal masses GU: No CVA tenderness Rectal: Normal sphincter tone.  Enlarged prostate with rubbery lateral lobes. Skin: No rashes, bruises or suspicious lesions. Neurologic: Grossly intact, no focal deficits, moving all 4 extremities. Psychiatric: Normal mood and affect.  Laboratory Data: Lab Results  Component Value Date   WBC 5.9 08/17/2017   HGB 13.7 08/17/2017   HCT 40.1 08/17/2017   MCV 83.9 08/17/2017   PLT 239 08/17/2017    Lab Results  Component Value Date   CREATININE 1.07 08/17/2017    PSA as above  Urinalysis    Component Value Date/Time   APPEARANCEUR Clear 11/06/2017 1305   GLUCOSEU Negative 11/06/2017 1305   BILIRUBINUR Negative 11/06/2017 1305   PROTEINUR Negative 11/06/2017 1305   NITRITE Negative 11/06/2017 1305   LEUKOCYTESUR Negative 11/06/2017 1305    Lab Results  Component Value Date   LABMICR See below: 11/06/2017   WBCUA None seen 11/06/2017   RBCUA 3-10 (A) 11/06/2017   LABEPIT None seen 11/06/2017   MUCUS Present (A) 11/06/2017   BACTERIA Few (A) 11/06/2017    Assessment & Plan:    1. History of elevated PSA History of elevated and fluctuating PSA CT scan with possible abnormality unilaterally on previous CT scan but PSA normalized previously Discussed options today and various options for further surveillance/diagnostic evaluation in light of above I recommend either really close surveillance with PSA again in 3 months, prostate MRI versus prostate biopsy He would like very close follow-up  with PSA in 3 months We will consider the latter aforementioned option if his PSA continues to rise - PSA; Future  2. BPH with urinary obstruction Prostamegaly with urinary obstruction Symptoms well controlled on Flomax We did discuss options of surgical management, not interested at this time  3. History of hematuria Status post negative hematuria work-up in 03/2016 Given his smoking history, repeat his urinalysis and consider repeat cystoscopy in the near future if his hematuria persist (high risk)   Return in about 3 months (around 12/12/2018) for 23month w/PSA.   Hollice Espy, MD  [facility] [address] [place], [place] [address] [phone]

## 2018-10-14 ENCOUNTER — Telehealth: Payer: Self-pay

## 2018-10-16 ENCOUNTER — Telehealth: Payer: Self-pay | Admitting: *Deleted

## 2018-10-16 DIAGNOSIS — Z122 Encounter for screening for malignant neoplasm of respiratory organs: Secondary | ICD-10-CM

## 2018-10-16 DIAGNOSIS — Z87891 Personal history of nicotine dependence: Secondary | ICD-10-CM

## 2018-10-16 NOTE — Telephone Encounter (Signed)
Attempted to contact regarding lung screening scan being due for this year x 2. However, when phone is answered, it is disconnected immediately.

## 2018-10-16 NOTE — Addendum Note (Signed)
Addended by: Lieutenant Diego on: 10/16/2018 09:27 AM   Modules accepted: Orders

## 2018-10-16 NOTE — Telephone Encounter (Signed)
Patient has been notified that annual lung cancer screening low dose CT scan is due currently or will be in near future. Confirmed that patient is within the age range of 55-77, and asymptomatic, (no signs or symptoms of lung cancer). Patient denies illness that would prevent curative treatment for lung cancer if found. Verified smoking history, (current, 33.75 pack year). The shared decision making visit was done 10/11/16. Patient is agreeable for CT scan being scheduled.

## 2018-10-22 ENCOUNTER — Other Ambulatory Visit: Payer: Self-pay

## 2018-10-22 ENCOUNTER — Ambulatory Visit
Admission: RE | Admit: 2018-10-22 | Discharge: 2018-10-22 | Disposition: A | Discharge status: Home / Self Care | Payer: BC Managed Care – PPO | Source: Ambulatory Visit | Attending: Oncology | Admitting: Oncology

## 2018-10-22 DIAGNOSIS — Z87891 Personal history of nicotine dependence: Secondary | ICD-10-CM | POA: Insufficient documentation

## 2018-10-22 DIAGNOSIS — Z122 Encounter for screening for malignant neoplasm of respiratory organs: Secondary | ICD-10-CM | POA: Insufficient documentation

## 2018-10-24 ENCOUNTER — Encounter: Payer: Self-pay | Admitting: *Deleted

## 2018-12-10 ENCOUNTER — Other Ambulatory Visit: Payer: Self-pay | Admitting: Urology

## 2018-12-10 ENCOUNTER — Other Ambulatory Visit: Payer: Self-pay

## 2018-12-10 ENCOUNTER — Other Ambulatory Visit: Payer: BC Managed Care – PPO

## 2018-12-10 DIAGNOSIS — Z87898 Personal history of other specified conditions: Secondary | ICD-10-CM

## 2018-12-11 LAB — PSA: Prostate Specific Ag, Serum: 6.1 ng/mL — ABNORMAL HIGH (ref 0.0–4.0)

## 2018-12-17 ENCOUNTER — Other Ambulatory Visit: Payer: Self-pay

## 2018-12-17 ENCOUNTER — Encounter: Payer: Self-pay | Admitting: Urology

## 2018-12-17 ENCOUNTER — Ambulatory Visit: Payer: BC Managed Care – PPO | Admitting: Urology

## 2018-12-17 VITALS — BP 176/94 | HR 94 | Ht 66.0 in | Wt 179.0 lb

## 2018-12-17 DIAGNOSIS — R3915 Urgency of urination: Secondary | ICD-10-CM | POA: Diagnosis not present

## 2018-12-17 DIAGNOSIS — Z87448 Personal history of other diseases of urinary system: Secondary | ICD-10-CM | POA: Diagnosis not present

## 2018-12-17 DIAGNOSIS — N401 Enlarged prostate with lower urinary tract symptoms: Secondary | ICD-10-CM | POA: Diagnosis not present

## 2018-12-17 DIAGNOSIS — Z87898 Personal history of other specified conditions: Secondary | ICD-10-CM

## 2018-12-17 NOTE — Patient Instructions (Signed)

## 2018-12-18 NOTE — Progress Notes (Signed)
12/17/2018 8:47 AM   Dierdre Forth [birth year] [id number]  Referring provider: Baxter Hire, MD [place],  [place] [address]  Chief Complaint  Patient presents with  . Elevated PSA    HPI: 69 year old male with a personal history of hematuria, prostamegaly and elevated PSA who returns today for 20-month follow-up.  Notably, he has a personal history of incidentally fluctuating PSA.  He also has CT scan finding  Prostate volume on CT scan 79 g. Notably, there is some slightly asymmetric enhancement on the right posterior medial peripheral zone of the mid gland and apex on CT Urogram 03/2017.  At the time, he also had a large direct inguinal hernia on the right with herniation of his bladder.    He notes again today that after repair of his hernia, his urinary symptoms improved significantly.  His symptoms remain stable and well-controlled on Flomax.  He has no complaints today.  Rectal exam at last visit with prostamegaly without nodules.  PSA trend: 6.1 on 12/10/2018 5.5  On 09/04/2018 3.68 on 03/2017 4.38 on 08/2016 3.55 on 02/2016 5.95 on 08/2015 3.27 on 08/2013   PMH: Past Medical History:  Diagnosis Date  . Anemia   . B12 deficiency   . ED (erectile dysfunction) 07/12/2017  . Elevated PSA, less than 10 ng/ml 09/22/2015  . Erectile dysfunction   . Hypertension   . Tobacco abuse 10/11/2016    Surgical History: Past Surgical History:  Procedure Laterality Date  . INGUINAL HERNIA REPAIR Bilateral 08/24/2017   Procedure: LAPAROSCOPIC INGUINAL HERNIA, bilateral;  Surgeon: Florene Glen, MD;  Location: [facility] ORS;  Service: General;  Laterality: Bilateral;  . none      Home Medications:  Allergies as of 12/17/2018   No Known Allergies     Medication List       Accurate as of December 17, 2018 11:59 PM. If you have any questions, ask your nurse or doctor.        aspirin EC 81 MG tablet TAKE 1 TABLET (81 MG TOTAL) BY MOUTH ONCE DAILY.    lisinopril-hydrochlorothiazide 20-25 MG tablet Commonly known as: ZESTORETIC Take 1 tablet by mouth daily.   tamsulosin 0.4 MG Caps capsule Commonly known as: FLOMAX TAKE 1 CAPSULE (0.4 MG TOTAL) BY MOUTH ONCE DAILY. TAKE 30 MINUTES AFTER SAME MEAL EACH DAY.   VITAMIN B-12 PO Take 1 tablet by mouth daily.       Allergies: No Known Allergies  Family History: Family History  Problem Relation Age of Onset  . Prostate cancer Neg Hx   . Kidney cancer Neg Hx     Social History:  reports that he has been smoking. He has a 22.00 pack-year smoking history. He has never used smokeless tobacco. He reports current alcohol use. He reports that he does not use drugs.  ROS: UROLOGY Frequent Urination?: No Hard to postpone urination?: No Burning/pain with urination?: No Get up at night to urinate?: No Leakage of urine?: No Urine stream starts and stops?: No Trouble starting stream?: No Do you have to strain to urinate?: No Blood in urine?: No Urinary tract infection?: No Sexually transmitted disease?: No Injury to kidneys or bladder?: No Painful intercourse?: No Weak stream?: No Erection problems?: No Penile pain?: No  Gastrointestinal Nausea?: No Vomiting?: No Indigestion/heartburn?: No Diarrhea?: No Constipation?: No  Constitutional Fever: No Night sweats?: No Weight loss?: No Fatigue?: No  Skin Skin rash/lesions?: No Itching?: No  Eyes Blurred vision?: No Double vision?:  No  Ears/Nose/Throat Sore throat?: No Sinus problems?: No  Hematologic/Lymphatic Swollen glands?: No Easy bruising?: No  Cardiovascular Leg swelling?: No Chest pain?: No  Respiratory Cough?: No Shortness of breath?: No  Endocrine Excessive thirst?: No  Musculoskeletal Back pain?: No Joint pain?: No  Neurological Headaches?: No Dizziness?: No  Psychologic Depression?: No Anxiety?: No  Physical Exam: BP (!) 176/94   Pulse 94   Ht 5\' 6"  (1.676 m)   Wt 179 lb  (81.2 kg)   BMI 28.89 kg/m   Constitutional:  Alert and oriented, No acute distress. HEENT: [place] AT, moist mucus membranes.  Trachea midline, no masses. Cardiovascular: No clubbing, cyanosis, or edema. Respiratory: Normal respiratory effort, no increased work of breathing. Skin: No rashes, bruises or suspicious lesions. Neurologic: Grossly intact, no focal deficits, moving all 4 extremities. Psychiatric: Normal mood and affect.  Laboratory Data: Lab Results  Component Value Date   WBC 5.9 08/17/2017   HGB 13.7 08/17/2017   HCT 40.1 08/17/2017   MCV 83.9 08/17/2017   PLT 239 08/17/2017    Lab Results  Component Value Date   CREATININE 1.07 08/17/2017    Assessment & Plan:    1. History of elevated PSA Personal history of fluctuating/rising PSA  Given the irregularity of prostate on CT scan along with overall upward trending PSA, I recommended consideration of prostate biopsy at this time.  Alternatives including prostate MRI and other restratification tools were also mention.  We discussed prostate biopsy in detail including the procedure itself, the risks of blood in the urine, stool, and ejaculate, serious infection, and discomfort. He is willing to proceed with this as discussed.  2. Benign prostatic hyperplasia (BPH) with urinary urgency Stable on Flomax, not interested in surgical intervention  3. History of hematuria Status post negative eval 2019   Return for Prostate biopsy (dec 16th at 1 pm).  Hollice Espy, MD  [facility] [address] [place], [place] [address] [phone]

## 2019-01-07 ENCOUNTER — Other Ambulatory Visit: Payer: Self-pay | Admitting: Urology

## 2019-01-08 ENCOUNTER — Ambulatory Visit: Payer: BC Managed Care – PPO | Admitting: Urology

## 2019-01-08 ENCOUNTER — Other Ambulatory Visit: Payer: Self-pay

## 2019-01-08 ENCOUNTER — Encounter: Payer: Self-pay | Admitting: Urology

## 2019-01-08 VITALS — BP 151/84 | HR 61 | Ht 66.0 in | Wt 179.6 lb

## 2019-01-08 DIAGNOSIS — Z87898 Personal history of other specified conditions: Secondary | ICD-10-CM | POA: Diagnosis not present

## 2019-01-08 MED ORDER — LEVOFLOXACIN 500 MG PO TABS
500.0000 mg | ORAL_TABLET | Freq: Once | ORAL | Status: AC
  Administered 2019-01-08: 13:00:00 500 mg via ORAL

## 2019-01-08 MED ORDER — GENTAMICIN SULFATE 40 MG/ML IJ SOLN
80.0000 mg | Freq: Once | INTRAMUSCULAR | Status: AC
Start: 2019-01-08 — End: 2019-01-08
  Administered 2019-01-08: 80 mg via INTRAMUSCULAR

## 2019-01-08 NOTE — Progress Notes (Signed)
   01/08/19  CC:  Chief Complaint  Patient presents with  . Prostate Biopsy    HPI: 68 yo m who presents today for prostate biopsy.  See previous notes for detail.  Blood pressure (!) 151/84, pulse 61, height 5\' 6"  (1.676 m), weight 179 lb 9.6 oz (81.5 kg). NED. A&Ox3.   No respiratory distress   Abd soft, NT, ND Normal sphincter tone  Prostate Biopsy Procedure   Informed consent was obtained after discussing risks/benefits of the procedure.  A time out was performed to ensure correct patient identity.  Pre-Procedure: - Gentamicin given prophylactically - Levaquin 500 mg administered PO -Transrectal Ultrasound performed revealing a 62.4 gm prostate -Multiple irregular hypoechoic lesions, R>L with slight intravesical protrusion of prostate gland  Procedure: - Prostate block performed using 10 cc 1% lidocaine and biopsies taken from sextant areas, a total of 12 under ultrasound guidance.  Post-Procedure: - Patient tolerated the procedure well - He was counseled to seek immediate medical attention if experiences any severe pain, significant bleeding, or fevers - Return in two week to discuss biopsy results   Hollice Espy, MD

## 2019-01-21 LAB — ANATOMIC PATHOLOGY REPORT: PDF Image: 0

## 2019-01-29 ENCOUNTER — Other Ambulatory Visit: Payer: Self-pay

## 2019-01-29 ENCOUNTER — Ambulatory Visit (INDEPENDENT_AMBULATORY_CARE_PROVIDER_SITE_OTHER): Payer: BC Managed Care – PPO | Admitting: Urology

## 2019-01-29 ENCOUNTER — Encounter: Payer: Self-pay | Admitting: Urology

## 2019-01-29 VITALS — BP 150/98 | HR 83 | Ht 66.0 in | Wt 183.0 lb

## 2019-01-29 DIAGNOSIS — C61 Malignant neoplasm of prostate: Secondary | ICD-10-CM

## 2019-01-29 DIAGNOSIS — N401 Enlarged prostate with lower urinary tract symptoms: Secondary | ICD-10-CM | POA: Diagnosis not present

## 2019-01-29 DIAGNOSIS — N138 Other obstructive and reflux uropathy: Secondary | ICD-10-CM | POA: Diagnosis not present

## 2019-01-29 NOTE — Progress Notes (Signed)
01/29/2019 12:45 PM   Dale Palmer 1951-01-21 [id number]  Referring provider: Baxter Hire, MD [place],  [place] [address]  Chief Complaint  Patient presents with  . Results    HPI: 69 year old male followed for history of fluctuating PSA and radiographic CT scan prostatic abnormality (CT urogram 2019) who presents today to discuss his prostate biopsy results.  Biopsy performed on 01/08/2019 reveals a single core of Gleason 3+3 at the left apex approximately 5% of the tissue.  TRUS vol 62.4.  He has a personal history of urinary symptoms which improved after hernia repair and reduction of his bladder from his hernia.  His symptoms continue to be well controlled on Flomax.  Is no urinary complaints today.  He has no erectile dysfunction.  PSA trend: 6.1 on 12/10/2018 5.5On 09/04/2018 3.68 on 03/2017 4.38 on 08/2016 3.55 on 02/2016 5.95 on 08/2015 3.27 on 08/2013   PMH: Past Medical History:  Diagnosis Date  . Anemia   . B12 deficiency   . ED (erectile dysfunction) 07/12/2017  . Elevated PSA, less than 10 ng/ml 09/22/2015  . Erectile dysfunction   . Hypertension   . Tobacco abuse 10/11/2016    Surgical History: Past Surgical History:  Procedure Laterality Date  . INGUINAL HERNIA REPAIR Bilateral 08/24/2017   Procedure: LAPAROSCOPIC INGUINAL HERNIA, bilateral;  Surgeon: Florene Glen, MD;  Location: [facility] ORS;  Service: General;  Laterality: Bilateral;  . none      Home Medications:  Allergies as of 01/29/2019   No Known Allergies     Medication List       Accurate as of January 29, 2019 11:59 PM. If you have any questions, ask your nurse or doctor.        aspirin EC 81 MG tablet TAKE 1 TABLET (81 MG TOTAL) BY MOUTH ONCE DAILY.   lisinopril-hydrochlorothiazide 20-25 MG tablet Commonly known as: ZESTORETIC Take 1 tablet by mouth daily.   tamsulosin 0.4 MG Caps capsule Commonly known as: FLOMAX TAKE 1 CAPSULE (0.4 MG TOTAL) BY  MOUTH ONCE DAILY. TAKE 30 MINUTES AFTER SAME MEAL EACH DAY.   VITAMIN B-12 PO Take 1 tablet by mouth daily.       Allergies: No Known Allergies  Family History: Family History  Problem Relation Age of Onset  . Prostate cancer Neg Hx   . Kidney cancer Neg Hx     Social History:  reports that he has been smoking. He has a 22.00 pack-year smoking history. He has never used smokeless tobacco. He reports current alcohol use. He reports that he does not use drugs.  ROS: Point review systems performed, negative other than as per HPI  Physical Exam: BP (!) 150/98   Pulse 83   Ht 5\' 6"  (1.676 m)   Wt 183 lb (83 kg)   BMI 29.54 kg/m   Constitutional:  Alert and oriented, No acute distress. HEENT: [place] AT, moist mucus membranes.  Trachea midline, no masses. Cardiovascular: No clubbing, cyanosis, or edema. Respiratory: Normal respiratory effort, no increased work of breathing. Neurologic: Grossly intact, no focal deficits, moving all 4 extremities. Psychiatric: Normal mood and affect.  Assessment & Plan:    1. Prostate cancer [facility]) 69 year old male with newly diagnosed very low risk prostate cancer involving only 1 core, low volume of Gleason 3+3  We discussed the natural history of prostate cancer as well as treatment recommendations for very low risk prostate cancer.  Active surveillance is first and foremost recommended.  We discussed various active active surveillance protocols.  Plan to follow very closely with PSA again in 4 months.  We will consider repeat biopsy versus MRI down the road.  He is agreeable this plan.  All questions answered.  He was provided literature on prostate cancer as well. - PSA; Future  2. BPH with urinary obstruction Symptoms stable and well controlled on Flomax only    Return in about 4 months (around 05/29/2019) for PSA.  Hollice Espy, MD  [facility] [address], [place], [place] [address] [phone]

## 2019-01-29 NOTE — Patient Instructions (Signed)
Prostate Cancer  The prostate is a male gland that helps make semen. Prostate cancer is when abnormal cells grow in this gland. Follow these instructions at home:  Take over-the-counter and prescription medicines only as told by your doctor.  Eat a healthy diet.  Get plenty of sleep.  Ask your doctor for help to find a support group for men with prostate cancer.  Keep all follow-up visits as told by your doctor. This is important.  If you have to go to the hospital, let your cancer doctor (oncologist) know.  Touch, hold, hug, and caress your partner to continue to show sexual feelings. Contact a doctor if:  You have trouble peeing (urinating).  You have blood in your pee (urine).  You have pain in your hips, back, or chest. Get help right away if:  You have weakness in your legs.  You lose feeling (have numbness) in your legs.  You cannot control your pee or your poop (stool).  You have trouble breathing.  You have sudden pain in your chest.  You have chills or a fever. Summary  The prostate is a male gland that helps make semen. Prostate cancer is when abnormal cells grow in this gland.  Ask your doctor for help to find a support group for men with prostate cancer.  Contact a doctor if you have problems peeing or have any new pain that you did not have before. This information is not intended to replace advice given to you by your health care provider. Make sure you discuss any questions you have with your health care provider. Document Revised: 12/22/2016 Document Reviewed: 09/20/2015 Elsevier Patient Education  2020 Elsevier Inc.  

## 2019-04-26 ENCOUNTER — Other Ambulatory Visit: Payer: Self-pay

## 2019-04-26 ENCOUNTER — Inpatient Hospital Stay
Admission: EM | Admit: 2019-04-26 | Discharge: 2019-04-30 | DRG: 370 | Disposition: A | Discharge status: Home / Self Care | Payer: BC Managed Care – PPO | Attending: Internal Medicine | Admitting: Internal Medicine

## 2019-04-26 DIAGNOSIS — E538 Deficiency of other specified B group vitamins: Secondary | ICD-10-CM | POA: Diagnosis present

## 2019-04-26 DIAGNOSIS — Z20822 Contact with and (suspected) exposure to covid-19: Secondary | ICD-10-CM | POA: Diagnosis present

## 2019-04-26 DIAGNOSIS — K219 Gastro-esophageal reflux disease without esophagitis: Secondary | ICD-10-CM | POA: Diagnosis present

## 2019-04-26 DIAGNOSIS — K226 Gastro-esophageal laceration-hemorrhage syndrome: Secondary | ICD-10-CM | POA: Diagnosis not present

## 2019-04-26 DIAGNOSIS — K922 Gastrointestinal hemorrhage, unspecified: Secondary | ICD-10-CM

## 2019-04-26 DIAGNOSIS — N529 Male erectile dysfunction, unspecified: Secondary | ICD-10-CM | POA: Diagnosis present

## 2019-04-26 DIAGNOSIS — Z7982 Long term (current) use of aspirin: Secondary | ICD-10-CM

## 2019-04-26 DIAGNOSIS — F17210 Nicotine dependence, cigarettes, uncomplicated: Secondary | ICD-10-CM | POA: Diagnosis present

## 2019-04-26 DIAGNOSIS — Z79899 Other long term (current) drug therapy: Secondary | ICD-10-CM

## 2019-04-26 DIAGNOSIS — I10 Essential (primary) hypertension: Secondary | ICD-10-CM | POA: Diagnosis present

## 2019-04-26 DIAGNOSIS — K920 Hematemesis: Secondary | ICD-10-CM | POA: Diagnosis not present

## 2019-04-26 DIAGNOSIS — N400 Enlarged prostate without lower urinary tract symptoms: Secondary | ICD-10-CM | POA: Diagnosis present

## 2019-04-26 NOTE — ED Triage Notes (Signed)
home via [facility]   throw up blood approx 2230 x1. pt no pain NAD at this time. Pt reports no nausea, lightheadedness, or history of low blood pressure

## 2019-04-26 NOTE — ED Notes (Signed)
Pt reports receiving the both Moderna vaccines as of February   Received 500 mL NS bolus en route via EMS

## 2019-04-27 ENCOUNTER — Emergency Department: Payer: BC Managed Care – PPO

## 2019-04-27 DIAGNOSIS — K920 Hematemesis: Secondary | ICD-10-CM | POA: Diagnosis not present

## 2019-04-27 LAB — BASIC METABOLIC PANEL
Anion gap: 6 (ref 5–15)
BUN: 28 mg/dL — ABNORMAL HIGH (ref 8–23)
CO2: 27 mmol/L (ref 22–32)
Calcium: 8.6 mg/dL — ABNORMAL LOW (ref 8.9–10.3)
Chloride: 107 mmol/L (ref 98–111)
Creatinine, Ser: 1.48 mg/dL — ABNORMAL HIGH (ref 0.61–1.24)
GFR calc Af Amer: 56 mL/min — ABNORMAL LOW (ref >60)
GFR calc non Af Amer: 48 mL/min — ABNORMAL LOW (ref >60)
Glucose, Bld: 131 mg/dL — ABNORMAL HIGH (ref 70–99)
Potassium: 3.8 mmol/L (ref 3.5–5.1)
Sodium: 140 mmol/L (ref 135–145)

## 2019-04-27 LAB — CBC WITH DIFFERENTIAL/PLATELET
Abs Immature Granulocytes: 0.02 10*3/uL (ref 0.00–0.07)
Basophils Absolute: 0 10*3/uL (ref 0.0–0.1)
Basophils Relative: 0 %
Eosinophils Absolute: 0.2 10*3/uL (ref 0.0–0.5)
Eosinophils Relative: 3 %
HCT: 31.5 % — ABNORMAL LOW (ref 39.0–52.0)
Hemoglobin: 10.3 g/dL — ABNORMAL LOW (ref 13.0–17.0)
Immature Granulocytes: 0 %
Lymphocytes Relative: 15 %
Lymphs Abs: 1.1 10*3/uL (ref 0.7–4.0)
MCH: 27.6 pg (ref 26.0–34.0)
MCHC: 32.7 g/dL (ref 30.0–36.0)
MCV: 84.5 fL (ref 80.0–100.0)
Monocytes Absolute: 0.4 10*3/uL (ref 0.1–1.0)
Monocytes Relative: 6 %
Neutro Abs: 5.7 10*3/uL (ref 1.7–7.7)
Neutrophils Relative %: 76 %
Platelets: 210 10*3/uL (ref 150–400)
RBC: 3.73 MIL/uL — ABNORMAL LOW (ref 4.22–5.81)
RDW: 14.6 % (ref 11.5–15.5)
WBC: 7.5 10*3/uL (ref 4.0–10.5)
nRBC: 0 % (ref 0.0–0.2)

## 2019-04-27 LAB — TYPE AND SCREEN
ABO/RH(D): O POS
Antibody Screen: NEGATIVE

## 2019-04-27 LAB — RESPIRATORY PANEL BY RT PCR (FLU A&B, COVID)
Influenza A by PCR: NEGATIVE
Influenza B by PCR: NEGATIVE
SARS Coronavirus 2 by RT PCR: NEGATIVE

## 2019-04-27 LAB — HEPATIC FUNCTION PANEL
ALT: 10 U/L (ref 0–44)
AST: 21 U/L (ref 15–41)
Albumin: 3.1 g/dL — ABNORMAL LOW (ref 3.5–5.0)
Alkaline Phosphatase: 35 U/L — ABNORMAL LOW (ref 38–126)
Bilirubin, Direct: 0.2 mg/dL (ref 0.0–0.2)
Indirect Bilirubin: 1 mg/dL — ABNORMAL HIGH (ref 0.3–0.9)
Total Bilirubin: 1.2 mg/dL (ref 0.3–1.2)
Total Protein: 6.2 g/dL — ABNORMAL LOW (ref 6.5–8.1)

## 2019-04-27 LAB — HIV ANTIBODY (ROUTINE TESTING W REFLEX): HIV Screen 4th Generation wRfx: NONREACTIVE

## 2019-04-27 LAB — PROTIME-INR
INR: 1.1 (ref 0.8–1.2)
Prothrombin Time: 14 seconds (ref 11.4–15.2)

## 2019-04-27 LAB — HEMOGLOBIN: Hemoglobin: 10.2 g/dL — ABNORMAL LOW (ref 13.0–17.0)

## 2019-04-27 MED ORDER — SODIUM CHLORIDE 0.9 % IV SOLN
8.0000 mg/h | INTRAVENOUS | Status: DC
  Administered 2019-04-27 – 2019-04-28 (×4): 8 mg/h via INTRAVENOUS
  Filled 2019-04-27 (×4): qty 80

## 2019-04-27 MED ORDER — ACETAMINOPHEN 650 MG RE SUPP: 650.0000 mg | Freq: Four times a day (QID) | RECTAL | Status: DC | PRN

## 2019-04-27 MED ORDER — ONDANSETRON HCL 4 MG/2ML IJ SOLN
4.0000 mg | Freq: Four times a day (QID) | INTRAMUSCULAR | Status: DC | PRN
  Administered 2019-04-27: 4 mg via INTRAVENOUS
  Filled 2019-04-27: qty 2

## 2019-04-27 MED ORDER — ACETAMINOPHEN 325 MG PO TABS: 650.0000 mg | ORAL_TABLET | Freq: Four times a day (QID) | ORAL | Status: DC | PRN

## 2019-04-27 MED ORDER — ONDANSETRON HCL 4 MG PO TABS: 4.0000 mg | ORAL_TABLET | Freq: Four times a day (QID) | ORAL | Status: DC | PRN

## 2019-04-27 MED ORDER — SODIUM CHLORIDE 0.9 % IV SOLN: INTRAVENOUS | Status: DC

## 2019-04-27 MED ORDER — SODIUM CHLORIDE 0.9 % IV SOLN
80.0000 mg | Freq: Once | INTRAVENOUS | Status: AC
  Administered 2019-04-27: 80 mg via INTRAVENOUS
  Filled 2019-04-27: qty 80

## 2019-04-27 NOTE — H&P (Signed)
History and Physical    Dale Palmer [id number] DOB: Dec 01, 1950 DOA: 04/26/2019  PCP: Baxter Hire, MD   Patient coming from: home I have personally briefly reviewed patient's old medical records in [place]  Chief Complaint: vomiting blood  HPI: Dale Palmer is a 69 y.o. male with medical history significant for Hypertension who presents to the emergency room with a single episode of emesis of bright red blood about a cupful.  Patient states he was in his usual state of health.  Said he was lying on the couch then felt nauseous started to belch and then vomited to be pure blood.  No prior history of gastric problems, liver disease, vomiting blood, dark or bloody stools.  He denied lightheadedness, palpitations or chest pain.  Denies abdominal pain or change in bowel habits  ED Course: Patient was initially soft in the emergency room at 109/74 down to 93/60 7-1 16/78 with IV fluids not tachycardic.  Hemoglobin was 10.3.  Creatinine 1.48 with baseline 1.072 years prior.  Showed no acute disease.  Patient was typed and screened.  No Protonix bolus and started on Protonix infusion.  Hospitalist consulted for admission  Review of Systems: As per HPI otherwise 10 point review of systems negative.    Past Medical History:  Diagnosis Date  . Anemia   . B12 deficiency   . ED (erectile dysfunction) 07/12/2017  . Elevated PSA, less than 10 ng/ml 09/22/2015  . Erectile dysfunction   . Hypertension   . Tobacco abuse 10/11/2016    Past Surgical History:  Procedure Laterality Date  . INGUINAL HERNIA REPAIR Bilateral 08/24/2017   Procedure: LAPAROSCOPIC INGUINAL HERNIA, bilateral;  Surgeon: Florene Glen, MD;  Location: [facility] ORS;  Service: General;  Laterality: Bilateral;  . none       reports that he has been smoking. He has a 22.00 pack-year smoking history. He has never used smokeless tobacco. He reports current alcohol use. He reports that he does not use drugs.  No  Known Allergies  Family History  Problem Relation Age of Onset  . Prostate cancer Neg Hx   . Kidney cancer Neg Hx      Prior to Admission medications   Medication Sig Start Date End Date Taking? Authorizing Provider  aspirin EC 81 MG tablet TAKE 1 TABLET (81 MG TOTAL) BY MOUTH ONCE DAILY. 09/18/16  Yes [provider]  Cyanocobalamin (VITAMIN B-12 PO) Take 1 tablet by mouth daily.   Yes [provider]  lisinopril-hydrochlorothiazide (PRINZIDE,ZESTORETIC) 20-25 MG per tablet Take 1 tablet by mouth daily.   Yes [provider]  tamsulosin (FLOMAX) 0.4 MG CAPS capsule TAKE 1 CAPSULE (0.4 MG TOTAL) BY MOUTH ONCE DAILY. TAKE 30 MINUTES AFTER SAME MEAL EACH DAY. 03/15/17  Yes [provider]    Physical Exam: Vitals:   04/27/19 0030 04/27/19 0100 04/27/19 0130 04/27/19 0200  BP: 107/75 93/67  116/78  Pulse: 69 66 68 75  Resp: 16 (!) 31 12 16   Temp:      TempSrc:      SpO2: 97% 100% 98% (!) 89%  Weight:      Height:         Vitals:   04/27/19 0030 04/27/19 0100 04/27/19 0130 04/27/19 0200  BP: 107/75 93/67  116/78  Pulse: 69 66 68 75  Resp: 16 (!) 31 12 16   Temp:      TempSrc:      SpO2: 97% 100% 98% (!) 89%  Weight:      Height:        Constitutional: Alert and awake, oriented x3, not in any acute distress. Eyes: PERLA, EOMI, irises appear normal, anicteric sclera,  ENMT: external ears and nose appear normal, normal hearing             Lips appears normal, oropharynx mucosa, tongue, posterior pharynx appear normal  Neck: neck appears normal, no masses, normal ROM, no thyromegaly, no JVD  CVS: S1-S2 clear, no murmur rubs or gallops,  , no carotid bruits, pedal pulses palpable, No LE edema Respiratory:  clear to auscultation bilaterally, no wheezing, rales or rhonchi. Respiratory effort normal. No accessory muscle use.  Abdomen: soft nontender, nondistended, normal bowel sounds, no hepatosplenomegaly, no hernias Musculoskeletal: : no  cyanosis, clubbing , no contractures or atrophy Neuro: Cranial nerves II-XII intact, sensation, reflexes normal, strength Psych: judgement and insight appear normal, stable mood and affect,  Skin: no rashes or lesions or ulcers, no induration or nodules   Labs on Admission: I have personally reviewed following labs and imaging studies  CBC: Recent Labs  Lab 04/27/19 0005  WBC 7.5  NEUTROABS 5.7  HGB 10.3*  HCT 31.5*  MCV 84.5  PLT [id number]   Basic Metabolic Panel: Recent Labs  Lab 04/27/19 0005  NA 140  K 3.8  CL 107  CO2 27  GLUCOSE 131*  BUN 28*  CREATININE 1.48*  CALCIUM 8.6*   GFR: Estimated Creatinine Clearance: 48.3 mL/min (A) (by C-G formula based on SCr of 1.48 mg/dL (H)). Liver Function Tests: Recent Labs  Lab 04/27/19 0005  AST 21  ALT 10  ALKPHOS 35*  BILITOT 1.2  PROT 6.2*  ALBUMIN 3.1*   No results for input(s): LIPASE, AMYLASE in the last 168 hours. No results for input(s): AMMONIA in the last 168 hours. Coagulation Profile: Recent Labs  Lab 04/27/19 0005  INR 1.1   Cardiac Enzymes: No results for input(s): CKTOTAL, CKMB, CKMBINDEX, TROPONINI in the last 168 hours. BNP (last 3 results) No results for input(s): PROBNP in the last 8760 hours. HbA1C: No results for input(s): HGBA1C in the last 72 hours. CBG: No results for input(s): GLUCAP in the last 168 hours. Lipid Profile: No results for input(s): CHOL, HDL, LDLCALC, TRIG, CHOLHDL, LDLDIRECT in the last 72 hours. Thyroid Function Tests: No results for input(s): TSH, T4TOTAL, FREET4, T3FREE, THYROIDAB in the last 72 hours. Anemia Panel: No results for input(s): VITAMINB12, FOLATE, FERRITIN, TIBC, IRON, RETICCTPCT in the last 72 hours. Urine analysis:    Component Value Date/Time   APPEARANCEUR Clear 11/06/2017 1305   GLUCOSEU Negative 11/06/2017 1305   BILIRUBINUR Negative 11/06/2017 1305   PROTEINUR Negative 11/06/2017 1305   NITRITE Negative 11/06/2017 1305   LEUKOCYTESUR Negative  11/06/2017 1305    Radiological Exams on Admission: DG Chest Port 1 View  Result Date: 04/27/2019 CLINICAL DATA:  Hematemesis. EXAM: PORTABLE CHEST 1 VIEW COMPARISON:  CT chest dated 10/22/2018 FINDINGS: The heart size and mediastinal contours are within normal limits. Both lungs are clear. The visualized skeletal structures are unremarkable. There are atherosclerotic changes of the thoracic aorta. IMPRESSION: No active disease. Electronically Signed   By: Constance Holster M.D.   On: 04/27/2019 00:19     Assessment/Plan    Hematemesis -Single episode of bloody emesis with no history of gastric or liver problems -Hemodynamically stable -Serial H&H and transfuse if needed -IV hydration -Protonix infusion -GI consult to evaluate for endoscopy    Hypertension -Labetalol as needed SBP  over 165  DVT prophylaxis: SCD Code Status: full code  Family Communication:  none  Disposition Plan: Back to previous home environment Consults called: GI  Status:obs    Athena Masse MD [facility]     04/27/2019, 2:52 AM

## 2019-04-27 NOTE — Progress Notes (Signed)
Patient admitted early this morning for one episode of hematemesis at home and another episode this morning in ED.  Initial blood pressure was low but has responded well to IV fluids.  Hemoglobin a year ago was 13.7 and is 10.3 on admission, with repeat at 10.2.  Patient states he feels okay, denies abdominal pain.  Denies recent NSAID use.  Is only on aspirin 81 mg a day.  Denies history of GI bleed.  Denies alcohol use.  Patient was seen by Dr. Vicente Males earlier today who is planning for EGD in the morning. Continue Protonix and IV fluids. As needed Zofran written.

## 2019-04-27 NOTE — Consult Note (Addendum)
Jonathon Bellows , MD [address], [place], [place], [place], [address] [address], [place], [place], [place], [address] Phone: [phone]  Fax: [phone]  Consultation  Referring Provider:     Dr Damita Dunnings  Primary Care Physician:  Baxter Hire, MD Primary Gastroenterologist:  Dr. Alice Reichert        Reason for Consultation:   GI Bleed  Date of Admission:  04/26/2019 Date of Consultation:  04/27/2019         HPI:   Dale Palmer is a 69 y.o. male is a patient of Dr. Alice Reichert previously seen at the clinic in November 2020.  Previous history of tubular adenomas.  Found to have a normocytic anemia at that time.  Plan was for outpatient colonoscopy.  Does not appear that it was performed..  Patient subsequently presented to the hospital with hematemesis.  On 81 mg of aspirin.  1 year back hemoglobin of 13.7 g.  On admission hemoglobin was 10.3 g with an MCV of 84.5. This morning hemoglobin is 10.2 g.  INR 1.1.  No significant elevation in BUN/creatinine ratio.  Had the first episode of hematemesis yesterday at home , 1/3rd cup dark blood, another in the hospital, non since, no abdominal pain , melena, rectal bleeding , nsaid use, similar episodes in the past . Feels well. Some weight loss. No bowel movement today  Past Medical History:  Diagnosis Date  . Anemia   . B12 deficiency   . ED (erectile dysfunction) 07/12/2017  . Elevated PSA, less than 10 ng/ml 09/22/2015  . Erectile dysfunction   . Hypertension   . Tobacco abuse 10/11/2016    Past Surgical History:  Procedure Laterality Date  . INGUINAL HERNIA REPAIR Bilateral 08/24/2017   Procedure: LAPAROSCOPIC INGUINAL HERNIA, bilateral;  Surgeon: Florene Glen, MD;  Location: [facility] ORS;  Service: General;  Laterality: Bilateral;  . none      Prior to Admission medications   Medication Sig Start Date End Date Taking? Authorizing Provider  aspirin EC 81 MG tablet TAKE 1 TABLET (81 MG TOTAL) BY MOUTH ONCE DAILY. 09/18/16  Yes  [provider]  Cyanocobalamin (VITAMIN B-12 PO) Take 1 tablet by mouth daily.   Yes [provider]  lisinopril-hydrochlorothiazide (PRINZIDE,ZESTORETIC) 20-25 MG per tablet Take 1 tablet by mouth daily.   Yes [provider]  tamsulosin (FLOMAX) 0.4 MG CAPS capsule TAKE 1 CAPSULE (0.4 MG TOTAL) BY MOUTH ONCE DAILY. TAKE 30 MINUTES AFTER SAME MEAL EACH DAY. 03/15/17  Yes [provider]    Family History  Problem Relation Age of Onset  . Prostate cancer Neg Hx   . Kidney cancer Neg Hx      Social History   Tobacco Use  . Smoking status: Current Every Day Smoker    Packs/day: 0.50    Years: 44.00    Pack years: 22.00  . Smokeless tobacco: Never Used  Substance Use Topics  . Alcohol use: Yes    Comment: occasional basis like at a birthday party  . Drug use: No    Allergies as of 04/26/2019  . (No Known Allergies)    Review of Systems:    All systems reviewed and negative except where noted in HPI.   Physical Exam:  Vital signs in last 24 hours: Temp:  [98.1 F (36.7 C)-98.4 F (36.9 C)] 98.2 F (36.8 C) (04/04 1130) Pulse Rate:  [57-76] 57 (04/04 1130) Resp:  [11-31] 16 (04/04 1130) BP: (93-127)/(67-88) 118/83 (04/04 1130)  SpO2:  [89 %-100 %] 100 % (04/04 1130) Weight:  [77.8 kg-83 kg] 77.8 kg (04/04 0914)   General:   Pleasant, cooperative in NAD Head:  Normocephalic and atraumatic. Eyes:   No icterus.   Conjunctiva pink. PERRLA. Ears:  Normal auditory acuity. Neck:  Supple; no masses or thyroidomegaly Lungs: Respirations even and unlabored. Lungs clear to auscultation bilaterally.   No wheezes, crackles, or rhonchi.  Heart:  Regular rate and rhythm;  Without murmur, clicks, rubs or gallops Abdomen:  Soft, nondistended, nontender. Normal bowel sounds. No appreciable masses or hepatomegaly.  No rebound or guarding.  Neurologic:  Alert and oriented x3;  grossly normal neurologically. Psych:  Alert and cooperative. Normal  affect.  LAB RESULTS: Recent Labs    04/27/19 0005 04/27/19 0514  WBC 7.5  --   HGB 10.3* 10.2*  HCT 31.5*  --   PLT 210  --    BMET Recent Labs    04/27/19 0005  NA 140  K 3.8  CL 107  CO2 27  GLUCOSE 131*  BUN 28*  CREATININE 1.48*  CALCIUM 8.6*   LFT Recent Labs    04/27/19 0005  PROT 6.2*  ALBUMIN 3.1*  AST 21  ALT 10  ALKPHOS 35*  BILITOT 1.2  BILIDIR 0.2  IBILI 1.0*   PT/INR Recent Labs    04/27/19 0005  LABPROT 14.0  INR 1.1    STUDIES: Continue on PPI. DG Chest Port 1 View  Result Date: 04/27/2019 CLINICAL DATA:  Hematemesis. EXAM: PORTABLE CHEST 1 VIEW COMPARISON:  CT chest dated 10/22/2018 FINDINGS: The heart size and mediastinal contours are within normal limits. Both lungs are clear. The visualized skeletal structures are unremarkable. There are atherosclerotic changes of the thoracic aorta. IMPRESSION: No active disease. Electronically Signed   By: Constance Holster M.D.   On: 04/27/2019 00:19      Impression / Plan:   Dale Palmer is a 69 y.o. y/o male admitted with hematemesis.  No significant elevation in BUN/creatinine ratio.  Slight drop in hemoglobin from baseline.  No significant change overnight and hemoglobin.  Plan 1.  EGD tomorrow. 2.  Monitor CBC and transfuse as needed. 3.  Continue PPI. 4. F/u with Dr Alice Reichert as an outpatient and obtain colonoscopy for colon cancer screening   I have discussed alternative options, risks & benefits,  which include, but are not limited to, bleeding, infection, perforation,respiratory complication & drug reaction.  The patient agrees with this plan & written consent will be obtained.    Thank you for involving me in the care of this patient.      LOS: 0 days   Jonathon Bellows, MD  04/27/2019, 12:20 PM

## 2019-04-27 NOTE — ED Provider Notes (Signed)
[facility] Emergency Department Provider Note ____________________________________________   First MD Initiated Contact with Patient 04/26/19 2347     (approximate)  I have reviewed the triage vital signs and the nursing notes.   HISTORY  Chief Complaint Hematemesis    HPI Dale Palmer is a 69 y.o. male with PMH as noted below who presents with single episode of hematemesis, acute onset this evening.  The patient states that he was lying on the couch, moved over to his bed and laid across it.  He then started to become nauseous, was belching, and had one episode of vomiting.  The vomitus contained bright red blood and the total amount was perhaps around 1 cup.  He states he feels much better now and has had no further vomiting.  He was feeling fine earlier today.  He has no prior history of hematemesis.  He denies any dark or bloody stools and denies any other abnormal bleeding.  He has no history of gastritis, ulcers, liver disease, or other GI problems.   Past Medical History:  Diagnosis Date  . Anemia   . B12 deficiency   . ED (erectile dysfunction) 07/12/2017  . Elevated PSA, less than 10 ng/ml 09/22/2015  . Erectile dysfunction   . Hypertension   . Tobacco abuse 10/11/2016    Patient Active Problem List   Diagnosis Date Noted  . Non-recurrent bilateral inguinal hernia without obstruction or gangrene   . Anemia, unspecified 07/12/2017  . B12 deficiency 07/12/2017  . ED (erectile dysfunction) 07/12/2017  . Hypertension 07/12/2017  . Tobacco abuse 10/11/2016  . Elevated PSA, less than 10 ng/ml 09/22/2015    Past Surgical History:  Procedure Laterality Date  . INGUINAL HERNIA REPAIR Bilateral 08/24/2017   Procedure: LAPAROSCOPIC INGUINAL HERNIA, bilateral;  Surgeon: Florene Glen, MD;  Location: [facility] ORS;  Service: General;  Laterality: Bilateral;  . none      Prior to Admission medications   Medication Sig Start Date End Date Taking?  Authorizing Provider  aspirin EC 81 MG tablet TAKE 1 TABLET (81 MG TOTAL) BY MOUTH ONCE DAILY. 09/18/16   [provider]  Cyanocobalamin (VITAMIN B-12 PO) Take 1 tablet by mouth daily.    [provider]  lisinopril-hydrochlorothiazide (PRINZIDE,ZESTORETIC) 20-25 MG per tablet Take 1 tablet by mouth daily.    [provider]  tamsulosin (FLOMAX) 0.4 MG CAPS capsule TAKE 1 CAPSULE (0.4 MG TOTAL) BY MOUTH ONCE DAILY. TAKE 30 MINUTES AFTER SAME MEAL EACH DAY. 03/15/17   [provider]    Allergies Patient has no known allergies.  Family History  Problem Relation Age of Onset  . Prostate cancer Neg Hx   . Kidney cancer Neg Hx     Social History Social History   Tobacco Use  . Smoking status: Current Every Day Smoker    Packs/day: 0.50    Years: 44.00    Pack years: 22.00  . Smokeless tobacco: Never Used  Substance Use Topics  . Alcohol use: Yes    Comment: occasional basis like at a birthday party  . Drug use: No    Review of Systems  Constitutional: No fever. Eyes: No redness.. ENT: No sore throat. Cardiovascular: Denies chest pain. Respiratory: Denies shortness of breath. Gastrointestinal: Positive for resolved vomiting. Genitourinary: Negative for dysuria or hematuria.  Musculoskeletal: Negative for back pain. Skin: Negative for rash. Neurological: Negative for headache.   ____________________________________________   PHYSICAL EXAM:  VITAL SIGNS: ED Triage Vitals  Enc  Vitals Group     BP 04/26/19 2347 109/74     Pulse Rate 04/26/19 2347 70     Resp 04/26/19 2347 15     Temp --      Temp src --      SpO2 04/26/19 2343 99 %     Weight 04/26/19 2350 183 lb (83 kg)     Height 04/26/19 2350 5\' 6"  (1.676 m)     Head Circumference --      Peak Flow --      Pain Score 04/26/19 2349 0     Pain Loc --      Pain Edu? --      Excl. in [place]? --     Constitutional: Alert and oriented. Well appearing and in no acute  distress. Eyes: Conjunctivae are normal.  Head: Atraumatic. Nose: No congestion/rhinnorhea. Mouth/Throat: Mucous membranes are moist.  Oropharynx clear. Neck: Normal range of motion.  Cardiovascular: Normal rate, regular rhythm. Grossly normal heart sounds.  Good peripheral circulation. Respiratory: Normal respiratory effort.  No retractions. Lungs CTAB. Gastrointestinal: Soft and nontender. No distention.  Genitourinary: No flank tenderness. Musculoskeletal: Extremities warm and well perfused.  Neurologic:  Normal speech and language. No gross focal neurologic deficits are appreciated.  Skin:  Skin is warm and dry. No rash noted. Psychiatric: Mood and affect are normal. Speech and behavior are normal.  ____________________________________________   LABS (all labs ordered are listed, but only abnormal results are displayed)  Labs Reviewed  BASIC METABOLIC PANEL - Abnormal; Notable for the following components:      Result Value   Glucose, Bld 131 (*)    BUN 28 (*)    Creatinine, Ser 1.48 (*)    Calcium 8.6 (*)    GFR calc non Af Amer 48 (*)    GFR calc Af Amer 56 (*)    All other components within normal limits  HEPATIC FUNCTION PANEL - Abnormal; Notable for the following components:   Total Protein 6.2 (*)    Albumin 3.1 (*)    Alkaline Phosphatase 35 (*)    Indirect Bilirubin 1.0 (*)    All other components within normal limits  CBC WITH DIFFERENTIAL/PLATELET - Abnormal; Notable for the following components:   RBC 3.73 (*)    Hemoglobin 10.3 (*)    HCT 31.5 (*)    All other components within normal limits  RESPIRATORY PANEL BY RT PCR (FLU A&B, COVID)  PROTIME-INR  TYPE AND SCREEN   ____________________________________________  EKG   ____________________________________________  RADIOLOGY  CXR: No focal infiltrate or other acute abnormality  ____________________________________________   PROCEDURES  Procedure(s) performed: No  Procedures  Critical  Care performed: No ____________________________________________   INITIAL IMPRESSION / ASSESSMENT AND PLAN / ED COURSE  Pertinent labs & imaging results that were available during my care of the patient were reviewed by me and considered in my medical decision making (see chart for details).  69 year old male with PMH as noted above presents with a single episode of hematemesis.  He reports feeling nauseated, belching, and then vomited.  He denies any prior GI history.  I reviewed the past medical records in [place].  He has had no recent ED visits or admissions.  He has no prior documented GI history.  On exam the patient is overall well-appearing.  His vital signs are normal.  The abdomen is soft and nontender.  The oropharynx is clear.  The physical exam is otherwise unremarkable.  Differential primarily includes gastritis or  PUD.  Since the patient was not vomiting previously, I doubt Mallory-Weiss tear.  Given the lack of history of liver disease I do not suspect esophageal varices.  We will obtain lab work-up, put the patient on a Protonix drip, and I anticipate admission.  ----------------------------------------- 1:58 AM on 04/27/2019 -----------------------------------------  The lab work-up is unremarkable.  The patient has remained clinically stable with no recurrent vomiting.  I discussed the case with Dr. Damita Dunnings from the hospitalist service for admission. ____________________________________________   FINAL CLINICAL IMPRESSION(S) / ED DIAGNOSES  Final diagnoses:  Hematemesis  Upper GI bleed      NEW MEDICATIONS STARTED DURING THIS VISIT:  New Prescriptions   No medications on file     Note:  This document was prepared using Dragon voice recognition software and may include unintentional dictation errors.    Arta Silence, MD 04/27/19 [phone]

## 2019-04-27 NOTE — Plan of Care (Signed)
Pt resting comfortably with c/o N/V/D, SOB, chest discomfort or abdominal pain.  Protonix infusing.  Wife at bedside for emotional support.  GI consult completed and plan for EGD tomorrow.  Pt currently NPO except ice chips (until midnight).   Problem: Education: Goal: Knowledge of General Education information will improve Description: Including pain rating scale, medication(s)/side effects and non-pharmacologic comfort measures Outcome: Progressing   Problem: Health Behavior/Discharge Planning: Goal: Ability to manage health-related needs will improve Outcome: Progressing   Problem: Clinical Measurements: Goal: Ability to maintain clinical measurements within normal limits will improve Outcome: Progressing Goal: Will remain free from infection Outcome: Progressing Goal: Diagnostic test results will improve Outcome: Progressing Goal: Respiratory complications will improve Outcome: Progressing Goal: Cardiovascular complication will be avoided Outcome: Progressing   Problem: Activity: Goal: Risk for activity intolerance will decrease Outcome: Progressing   Problem: Nutrition: Goal: Adequate nutrition will be maintained Outcome: Progressing   Problem: Coping: Goal: Level of anxiety will decrease Outcome: Progressing   Problem: Elimination: Goal: Will not experience complications related to bowel motility Outcome: Progressing Goal: Will not experience complications related to urinary retention Outcome: Progressing   Problem: Pain Managment: Goal: General experience of comfort will improve Outcome: Progressing   Problem: Safety: Goal: Ability to remain free from injury will improve Outcome: Progressing   Problem: Skin Integrity: Goal: Risk for impaired skin integrity will decrease Outcome: Progressing   Problem: Education: Goal: Ability to identify signs and symptoms of gastrointestinal bleeding will improve Outcome: Progressing   Problem: Bowel/Gastric: Goal:  Will show no signs and symptoms of gastrointestinal bleeding Outcome: Progressing   Problem: Fluid Volume: Goal: Will show no signs and symptoms of excessive bleeding Outcome: Progressing   Problem: Clinical Measurements: Goal: Complications related to the disease process, condition or treatment will be avoided or minimized Outcome: Progressing

## 2019-04-28 ENCOUNTER — Observation Stay: Payer: BC Managed Care – PPO | Admitting: Anesthesiology

## 2019-04-28 ENCOUNTER — Encounter: Payer: Self-pay | Admitting: Internal Medicine

## 2019-04-28 ENCOUNTER — Encounter
Admission: EM | Disposition: A | Discharge status: Home / Self Care | Payer: Self-pay | Source: Home / Self Care | Attending: Internal Medicine

## 2019-04-28 DIAGNOSIS — K226 Gastro-esophageal laceration-hemorrhage syndrome: Secondary | ICD-10-CM | POA: Diagnosis present

## 2019-04-28 DIAGNOSIS — K219 Gastro-esophageal reflux disease without esophagitis: Secondary | ICD-10-CM | POA: Diagnosis present

## 2019-04-28 DIAGNOSIS — K922 Gastrointestinal hemorrhage, unspecified: Secondary | ICD-10-CM

## 2019-04-28 DIAGNOSIS — Z79899 Other long term (current) drug therapy: Secondary | ICD-10-CM | POA: Diagnosis not present

## 2019-04-28 DIAGNOSIS — F17210 Nicotine dependence, cigarettes, uncomplicated: Secondary | ICD-10-CM | POA: Diagnosis present

## 2019-04-28 DIAGNOSIS — N400 Enlarged prostate without lower urinary tract symptoms: Secondary | ICD-10-CM | POA: Diagnosis present

## 2019-04-28 DIAGNOSIS — Z7982 Long term (current) use of aspirin: Secondary | ICD-10-CM | POA: Diagnosis not present

## 2019-04-28 DIAGNOSIS — E538 Deficiency of other specified B group vitamins: Secondary | ICD-10-CM | POA: Diagnosis present

## 2019-04-28 DIAGNOSIS — I10 Essential (primary) hypertension: Secondary | ICD-10-CM | POA: Diagnosis present

## 2019-04-28 DIAGNOSIS — K920 Hematemesis: Secondary | ICD-10-CM | POA: Diagnosis present

## 2019-04-28 DIAGNOSIS — N529 Male erectile dysfunction, unspecified: Secondary | ICD-10-CM | POA: Diagnosis present

## 2019-04-28 DIAGNOSIS — Z20822 Contact with and (suspected) exposure to covid-19: Secondary | ICD-10-CM | POA: Diagnosis present

## 2019-04-28 HISTORY — PX: ESOPHAGOGASTRODUODENOSCOPY (EGD) WITH PROPOFOL: SHX5813

## 2019-04-28 LAB — CBC
HCT: 27.1 % — ABNORMAL LOW (ref 39.0–52.0)
Hemoglobin: 9 g/dL — ABNORMAL LOW (ref 13.0–17.0)
MCH: 27.7 pg (ref 26.0–34.0)
MCHC: 33.2 g/dL (ref 30.0–36.0)
MCV: 83.4 fL (ref 80.0–100.0)
Platelets: 178 10*3/uL (ref 150–400)
RBC: 3.25 MIL/uL — ABNORMAL LOW (ref 4.22–5.81)
RDW: 14.4 % (ref 11.5–15.5)
WBC: 7.5 10*3/uL (ref 4.0–10.5)
nRBC: 0 % (ref 0.0–0.2)

## 2019-04-28 LAB — BASIC METABOLIC PANEL
Anion gap: 6 (ref 5–15)
BUN: 21 mg/dL (ref 8–23)
CO2: 26 mmol/L (ref 22–32)
Calcium: 8.4 mg/dL — ABNORMAL LOW (ref 8.9–10.3)
Chloride: 109 mmol/L (ref 98–111)
Creatinine, Ser: 1.07 mg/dL (ref 0.61–1.24)
GFR calc Af Amer: >60 mL/min (ref >60)
GFR calc non Af Amer: >60 mL/min (ref >60)
Glucose, Bld: 82 mg/dL (ref 70–99)
Potassium: 3.5 mmol/L (ref 3.5–5.1)
Sodium: 141 mmol/L (ref 135–145)

## 2019-04-28 SURGERY — ESOPHAGOGASTRODUODENOSCOPY (EGD) WITH PROPOFOL
Anesthesia: General

## 2019-04-28 MED ORDER — SODIUM CHLORIDE 0.9% FLUSH
10.0000 mL | Freq: Two times a day (BID) | INTRAVENOUS | Status: DC
  Administered 2019-04-28 – 2019-04-30 (×3): 10 mL via INTRAVENOUS

## 2019-04-28 MED ORDER — FENTANYL CITRATE (PF) 100 MCG/2ML IJ SOLN
INTRAMUSCULAR | Status: AC
  Filled 2019-04-28: qty 2

## 2019-04-28 MED ORDER — MIDAZOLAM HCL 2 MG/2ML IJ SOLN
INTRAMUSCULAR | Status: AC
  Filled 2019-04-28: qty 2

## 2019-04-28 MED ORDER — PANTOPRAZOLE SODIUM 40 MG IV SOLR
40.0000 mg | Freq: Two times a day (BID) | INTRAVENOUS | Status: DC
  Administered 2019-04-28 – 2019-04-30 (×5): 40 mg via INTRAVENOUS
  Filled 2019-04-28 (×5): qty 40

## 2019-04-28 MED ORDER — MIDAZOLAM HCL 5 MG/5ML IJ SOLN
INTRAMUSCULAR | Status: DC | PRN
  Administered 2019-04-28: 2 mg via INTRAVENOUS

## 2019-04-28 MED ORDER — LIDOCAINE HCL (PF) 2 % IJ SOLN
INTRAMUSCULAR | Status: AC
  Filled 2019-04-28: qty 10

## 2019-04-28 MED ORDER — TAMSULOSIN HCL 0.4 MG PO CAPS
0.4000 mg | ORAL_CAPSULE | Freq: Every day | ORAL | Status: DC
  Administered 2019-04-28 – 2019-04-29 (×2): 0.4 mg via ORAL
  Filled 2019-04-28 (×2): qty 1

## 2019-04-28 MED ORDER — PROPOFOL 500 MG/50ML IV EMUL
INTRAVENOUS | Status: DC | PRN
  Administered 2019-04-28: 50 ug/kg/min via INTRAVENOUS

## 2019-04-28 MED ORDER — FENTANYL CITRATE (PF) 100 MCG/2ML IJ SOLN
INTRAMUSCULAR | Status: DC | PRN
  Administered 2019-04-28 (×2): 50 ug via INTRAVENOUS

## 2019-04-28 MED ORDER — PROPOFOL 500 MG/50ML IV EMUL
INTRAVENOUS | Status: AC
  Filled 2019-04-28: qty 50

## 2019-04-28 MED ORDER — LIDOCAINE HCL (PF) 2 % IJ SOLN
INTRAMUSCULAR | Status: DC | PRN
  Administered 2019-04-28: 80 mg

## 2019-04-28 MED ORDER — PROPOFOL 10 MG/ML IV BOLUS
INTRAVENOUS | Status: DC | PRN
  Administered 2019-04-28 (×2): 20 mg via INTRAVENOUS

## 2019-04-28 NOTE — Anesthesia Postprocedure Evaluation (Signed)
Anesthesia Post Note  Patient: Dale Palmer  Procedure(s) Performed: ESOPHAGOGASTRODUODENOSCOPY (EGD) WITH PROPOFOL (N/A )  Patient location during evaluation: PACU Anesthesia Type: General Level of consciousness: awake and alert Pain management: pain level controlled Vital Signs Assessment: post-procedure vital signs reviewed and stable Respiratory status: spontaneous breathing, nonlabored ventilation and respiratory function stable Cardiovascular status: blood pressure returned to baseline and stable Postop Assessment: no apparent nausea or vomiting Anesthetic complications: no     Last Vitals:  Vitals:   04/28/19 1127 04/28/19 1203  BP:  126/83  Pulse: (!) 58 (!) 50  Resp:  17  Temp:  36.5 C  SpO2: 100% 99%    Last Pain:  Vitals:   04/28/19 1203  TempSrc: Oral  PainSc:                  [name]

## 2019-04-28 NOTE — Transfer of Care (Signed)
Immediate Anesthesia Transfer of Care Note  Patient: Dale Palmer  Procedure(s) Performed: ESOPHAGOGASTRODUODENOSCOPY (EGD) WITH PROPOFOL (N/A )  Patient Location: PACU  Anesthesia Type:General  Level of Consciousness: awake, alert  and oriented  Airway & Oxygen Therapy: Patient Spontanous Breathing and Patient connected to nasal cannula oxygen  Post-op Assessment: Report given to RN and Post -op Vital signs reviewed and stable  Post vital signs: Reviewed and stable  Last Vitals:  Vitals Value Taken Time  BP 103/75 04/28/19 1101  Temp 36.3 C 04/28/19 1101  Pulse 68 04/28/19 1101  Resp 10 04/28/19 1101  SpO2 100 % 04/28/19 1101  Vitals shown include unvalidated device data.  Last Pain:  Vitals:   04/28/19 1101  TempSrc: Temporal  PainSc:          Complications: No apparent anesthesia complications

## 2019-04-28 NOTE — H&P (Signed)
Dale Bellows, MD [address] [place], [place], [place], [address] [address], [place], [place], [place], [address] Phone: [phone]  Fax: [phone]  Primary Care Physician:  Baxter Hire, MD   Pre-Procedure History & Physical: HPI:  Dale Palmer is a 69 y.o. male is here for an endoscopy    Past Medical History:  Diagnosis Date  . Anemia   . B12 deficiency   . ED (erectile dysfunction) 07/12/2017  . Elevated PSA, less than 10 ng/ml 09/22/2015  . Erectile dysfunction   . Hypertension   . Tobacco abuse 10/11/2016    Past Surgical History:  Procedure Laterality Date  . INGUINAL HERNIA REPAIR Bilateral 08/24/2017   Procedure: LAPAROSCOPIC INGUINAL HERNIA, bilateral;  Surgeon: Florene Glen, MD;  Location: [facility] ORS;  Service: General;  Laterality: Bilateral;  . none      Prior to Admission medications   Medication Sig Start Date End Date Taking? Authorizing Provider  aspirin EC 81 MG tablet TAKE 1 TABLET (81 MG TOTAL) BY MOUTH ONCE DAILY. 09/18/16  Yes [provider]  Cyanocobalamin (VITAMIN B-12 PO) Take 1 tablet by mouth daily.   Yes [provider]  lisinopril-hydrochlorothiazide (PRINZIDE,ZESTORETIC) 20-25 MG per tablet Take 1 tablet by mouth daily.   Yes [provider]  tamsulosin (FLOMAX) 0.4 MG CAPS capsule TAKE 1 CAPSULE (0.4 MG TOTAL) BY MOUTH ONCE DAILY. TAKE 30 MINUTES AFTER SAME MEAL EACH DAY. 03/15/17  Yes [provider]    Allergies as of 04/26/2019  . (No Known Allergies)    Family History  Problem Relation Age of Onset  . Prostate cancer Neg Hx   . Kidney cancer Neg Hx     Social History   Socioeconomic History  . Marital status: Married    Spouse name: Not on file  . Number of children: Not on file  . Years of education: Not on file  . Highest education level: Not on file  Occupational History  . Not on file  Tobacco Use  . Smoking status: Current Every Day Smoker    Packs/day: 0.50     Years: 44.00    Pack years: 22.00  . Smokeless tobacco: Never Used  Substance and Sexual Activity  . Alcohol use: Yes    Comment: occasional basis like at a birthday party  . Drug use: No  . Sexual activity: Yes  Other Topics Concern  . Not on file  Social History Narrative  . Not on file   Social Determinants of Health   Financial Resource Strain:   . Difficulty of Paying Living Expenses:   Food Insecurity:   . Worried About Charity fundraiser in the Last Year:   . Arboriculturist in the Last Year:   Transportation Needs:   . Film/video editor (Medical):   Marland Kitchen Lack of Transportation (Non-Medical):   Physical Activity:   . Days of Exercise per Week:   . Minutes of Exercise per Session:   Stress:   . Feeling of Stress :   Social Connections:   . Frequency of Communication with Friends and Family:   . Frequency of Social Gatherings with Friends and Family:   . Attends Religious Services:   . Active Member of Clubs or Organizations:   . Attends Archivist Meetings:   Marland Kitchen Marital Status:   Intimate Partner Violence:   . Fear of Current or Ex-Partner:   . Emotionally Abused:   .  Physically Abused:   . Sexually Abused:     Review of Systems: See HPI, otherwise negative ROS  Physical Exam: BP 123/79 (BP Location: Right Arm)   Pulse (!) 59   Temp 97.8 F (36.6 C) (Oral)   Resp 16   Ht 5\' 6"  (1.676 m)   Wt 78.7 kg   SpO2 98%   BMI 27.99 kg/m  General:   Alert,  pleasant and cooperative in NAD Head:  Normocephalic and atraumatic. Neck:  Supple; no masses or thyromegaly. Lungs:  Clear throughout to auscultation, normal respiratory effort.    Heart:  +S1, +S2, Regular rate and rhythm, No edema. Abdomen:  Soft, nontender and nondistended. Normal bowel sounds, without guarding, and without rebound.   Neurologic:  Alert and  oriented x4;  grossly normal neurologically.  Impression/Plan: [name] is here for an endoscopy  to be performed for   evaluation of gi bleed    Risks, benefits, limitations, and alternatives regarding endoscopy have been reviewed with the patient.  Questions have been answered.  All parties agreeable.   Dale Bellows, MD  04/28/2019, 10:24 AM

## 2019-04-28 NOTE — Progress Notes (Signed)
Security returned pts belongings

## 2019-04-28 NOTE — Progress Notes (Signed)
PROGRESS NOTE    Dale Palmer  [id number]  DOB: September 03, 1950  DOA: 04/26/2019 PCP: Baxter Hire, MD Outpatient Specialists:   Hospital course:  69 year old man with HTN and GERD was admitted 04/27/2019 with hematemesis.  He underwent EGD on 04/28/2019 which showed Mallory-Weiss tear requiring 4 clips.  He has been hemodynamically stable.   Subjective:  Patient feels relatively well.  No further nausea or vomiting.   Objective: Vitals:   04/28/19 1121 04/28/19 1127 04/28/19 1203 04/28/19 1605  BP: 119/76  126/83 135/79  Pulse: (!) 58 (!) 58 (!) 50 (!) 52  Resp: 17  17 16   Temp:   97.7 F (36.5 C) 98.1 F (36.7 C)  TempSrc:   Oral Oral  SpO2: 100% 100% 99% 100%  Weight:      Height:        Intake/Output Summary (Last 24 hours) at 04/28/2019 1656 Last data filed at 04/28/2019 1605 Gross per 24 hour  Intake 2406.11 ml  Output 850 ml  Net 1556.11 ml   Filed Weights   04/27/19 0914 04/28/19 0405 04/28/19 1031  Weight: 77.8 kg 78.7 kg 78.7 kg     Assessment & Plan:   Hematemesis Patient is hemodynamically stable with hemoglobin decreasing from 10.3 to 9 this morning Underwent EGD this morning which showed nonbleeding Mallory-Weiss tear requiring 4 clips. Plan is to continue IV Protonix for 72 hours  HTN Hold lisinopril hydrochlorothiazide until blood pressure has normalized  BPH Continue Flomax    DVT prophylaxis: SCD Code Status: Full Family Communication: Patient's wife has been at bedside throughout Disposition Plan:   Patient is from: Home  Anticipated Discharge Location: Home  Barriers to Discharge: Needs IV Protonix for 72 hours  Is patient medically stable for Discharge: Not yet   Consultants:  GI  Procedures:  Status post EGD 04/28/2019  Antimicrobials:  None   Exam:  General: Well-appearing man sitting up in bed with wife at bedside Eyes: sclera anicteric, conjuctiva mild injection bilaterally CVS: S1-S2, regular    Respiratory:  decreased air entry bilaterally secondary to decreased inspiratory effort, rales at bases  GI: NABS, soft, NT  LE: No edema.  Neuro: A/O x 3, Moving all extremities equally with normal strength, CN 3-12 intact, grossly nonfocal.  Psych: patient is logical and coherent, judgement and insight appear normal, mood and affect appropriate to situation.   Data Reviewed: Basic Metabolic Panel: Recent Labs  Lab 04/27/19 0005 04/28/19 0504  NA 140 141  K 3.8 3.5  CL 107 109  CO2 27 26  GLUCOSE 131* 82  BUN 28* 21  CREATININE 1.48* 1.07  CALCIUM 8.6* 8.4*   Liver Function Tests: Recent Labs  Lab 04/27/19 0005  AST 21  ALT 10  ALKPHOS 35*  BILITOT 1.2  PROT 6.2*  ALBUMIN 3.1*   No results for input(s): LIPASE, AMYLASE in the last 168 hours. No results for input(s): AMMONIA in the last 168 hours. CBC: Recent Labs  Lab 04/27/19 0005 04/27/19 0514 04/28/19 0504  WBC 7.5  --  7.5  NEUTROABS 5.7  --   --   HGB 10.3* 10.2* 9.0*  HCT 31.5*  --  27.1*  MCV 84.5  --  83.4  PLT 210  --  178   Cardiac Enzymes: No results for input(s): CKTOTAL, CKMB, CKMBINDEX, TROPONINI in the last 168 hours. BNP (last 3 results) No results for input(s): PROBNP in the last 8760 hours. CBG: No results for input(s): GLUCAP in the last  168 hours.  Recent Results (from the past 240 hour(s))  Respiratory Panel by RT PCR (Flu A&B, Covid) - Nasopharyngeal Swab     Status: None   Collection Time: 04/27/19  1:39 AM   Specimen: Nasopharyngeal Swab  Result Value Ref Range Status   SARS Coronavirus 2 by RT PCR NEGATIVE NEGATIVE Final    Comment: (NOTE) SARS-CoV-2 target nucleic acids are NOT DETECTED. The SARS-CoV-2 RNA is generally detectable in upper respiratoy specimens during the acute phase of infection. The lowest concentration of SARS-CoV-2 viral copies this assay can detect is 131 copies/mL. A negative result does not preclude SARS-Cov-2 infection and should not be used as the  sole basis for treatment or other patient management decisions. A negative result may occur with  improper specimen collection/handling, submission of specimen other than nasopharyngeal swab, presence of viral mutation(s) within the areas targeted by this assay, and inadequate number of viral copies (<131 copies/mL). A negative result must be combined with clinical observations, patient history, and epidemiological information. The expected result is Negative. Fact Sheet for Patients:  PinkCheek.be Fact Sheet for Healthcare Providers:  GravelBags.it This test is not yet ap proved or cleared by the [place] FDA and  has been authorized for detection and/or diagnosis of SARS-CoV-2 by FDA under an Emergency Use Authorization (EUA). This EUA will remain  in effect (meaning this test can be used) for the duration of the COVID-19 declaration under Section 564(b)(1) of the Act, 21 U.S.C. section 360bbb-3(b)(1), unless the authorization is terminated or revoked sooner.    Influenza A by PCR NEGATIVE NEGATIVE Final   Influenza B by PCR NEGATIVE NEGATIVE Final    Comment: (NOTE) The Xpert Xpress SARS-CoV-2/FLU/RSV assay is intended as an aid in  the diagnosis of influenza from Nasopharyngeal swab specimens and  should not be used as a sole basis for treatment. Nasal washings and  aspirates are unacceptable for Xpert Xpress SARS-CoV-2/FLU/RSV  testing. Fact Sheet for Patients: PinkCheek.be Fact Sheet for Healthcare Providers: GravelBags.it This test is not yet approved or cleared by the [place] FDA and  has been authorized for detection and/or diagnosis of SARS-CoV-2 by  FDA under an Emergency Use Authorization (EUA). This EUA will remain  in effect (meaning this test can be used) for the duration of the  Covid-19 declaration under Section 564(b)(1) of the Act, 21    U.S.C. section 360bbb-3(b)(1), unless the authorization is  terminated or revoked. Performed at [facility], [address], [place],  [address]       Studies: [facility] Chest [place] 1 View  Result Date: 04/27/2019 CLINICAL DATA:  Hematemesis. EXAM: PORTABLE CHEST 1 VIEW COMPARISON:  CT chest dated 10/22/2018 FINDINGS: The heart size and mediastinal contours are within normal limits. Both lungs are clear. The visualized skeletal structures are unremarkable. There are atherosclerotic changes of the thoracic aorta. IMPRESSION: No active disease. Electronically Signed   By: Constance Holster M.D.   On: 04/27/2019 00:19     Scheduled Meds: . pantoprazole (PROTONIX) IV  40 mg Intravenous Q12H   Continuous Infusions: . sodium chloride 300 mL/hr at 04/28/19 1102    Active Problems:   Hypertension   Hematemesis   Mallory-Weiss tear     Dewaine Oats Derek Jack, [facility]  If 7PM-7AM, please contact night-coverage [email or web address] Password [email or web address] 04/28/2019, 4:56 PM    LOS: 0 days

## 2019-04-28 NOTE — Anesthesia Preprocedure Evaluation (Addendum)
Anesthesia Evaluation  Patient identified by MRN, date of birth, ID band Patient awake    Reviewed: Allergy & Precautions, H&P , NPO status , Patient's Chart, lab work & pertinent test results  Airway Mallampati: III  TM Distance: >3 FB Neck ROM: full    Dental  (+) Partial Upper   Pulmonary neg shortness of breath, neg COPD, Current Smoker and Patient abstained from smoking.,    breath sounds clear to auscultation       Cardiovascular hypertension, (-) angina(-) Past MI and (-) Cardiac Stents (-) dysrhythmias  Rhythm:regular Rate:Normal     Neuro/Psych negative neurological ROS  negative psych ROS   GI/Hepatic Neg liver ROS, Single episode of hematemesis   Endo/Other  negative endocrine ROS  Renal/GU      Musculoskeletal   Abdominal   Peds  Hematology  (+) Blood dyscrasia, anemia ,   Anesthesia Other Findings Past Medical History: No date: Anemia No date: B12 deficiency 07/12/2017: ED (erectile dysfunction) 09/22/2015: Elevated PSA, less than 10 ng/ml No date: Erectile dysfunction No date: Hypertension 10/11/2016: Tobacco abuse  Past Surgical History: 08/24/2017: INGUINAL HERNIA REPAIR; Bilateral     Comment:  Procedure: LAPAROSCOPIC INGUINAL HERNIA, bilateral;                Surgeon: Florene Glen, MD;  Location: [facility] ORS;                Service: General;  Laterality: Bilateral; No date: none  BMI    Body Mass Index: 27.99 kg/m      Reproductive/Obstetrics negative OB ROS                            Anesthesia Physical Anesthesia Plan  ASA: II  Anesthesia Plan: General   Post-op Pain Management:    Induction:   PONV Risk Score and Plan: Treatment may vary due to age or medical condition and Propofol infusion  Airway Management Planned: Natural Airway and Nasal Cannula  Additional Equipment:   Intra-op Plan:   Post-operative Plan:   Informed Consent: I have  reviewed the patients History and Physical, chart, labs and discussed the procedure including the risks, benefits and alternatives for the proposed anesthesia with the patient or authorized representative who has indicated his/her understanding and acceptance.     Dental Advisory Given  Plan Discussed with: Anesthesiologist  Anesthesia Plan Comments:        Anesthesia Quick Evaluation

## 2019-04-28 NOTE — Op Note (Signed)
[facility] Gastroenterology Patient Name: Dale Palmer Procedure Date: 04/28/2019 10:44 AM MRN: [id number] Account #: [id number] Date of Birth: 05/30/1950 Admit Type: Outpatient Age: 69 Room: [facility] ENDO ROOM 4 Gender: Male Note Status: Finalized Procedure:             Upper GI endoscopy Indications:           Hematemesis Providers:             Jonathon Bellows MD, MD Medicines:             Monitored Anesthesia Care Complications:         No immediate complications. Procedure:             Pre-Anesthesia Assessment:                        - Prior to the procedure, a History and Physical was                         performed, and patient medications, allergies and                         sensitivities were reviewed. The patient's tolerance                         of previous anesthesia was reviewed.                        - The risks and benefits of the procedure and the                         sedation options and risks were discussed with the                         patient. All questions were answered and informed                         consent was obtained.                        - ASA Grade Assessment: III - A patient with severe                         systemic disease.                        After obtaining informed consent, the endoscope was                         passed under direct vision. Throughout the procedure,                         the patient's blood pressure, pulse, and oxygen                         saturations were monitored continuously. The Endoscope                         was introduced through the mouth, and advanced to the  third part of duodenum. The upper GI endoscopy was                         accomplished with ease. The patient tolerated the                         procedure well. Findings:      The examined duodenum was normal.      The stomach was normal.      The cardia and gastric fundus were normal on  retroflexion.      A 20 mm non-bleeding Mallory-Weiss tear with stigmata of recent bleeding       was found. To repair the defect, the tissue edges were approximated and       four hemostatic clips were successfully placed. Closure of the defect       was successful. There was no bleeding during, or at the end, of the       procedure. Impression:            - Normal examined duodenum.                        - Normal stomach.                        - Mallory-Weiss tear. Clips were placed.                        - No specimens collected. Recommendation:        - Return patient to hospital ward for ongoing care.                        - Clear liquid diet today.                        - Total 72 hours of IV PPI GTTthen home if stable                        Continue PPI prilosec 40 mg daily for 6 weeks as an                         outpatient                        Advance diet if stable tomorrow                        May need repeat EGD in 6-8 weeks to check for healing                         of the deep ulcer seen at the GE junction which I                         suspect was the tear Procedure Code(s):     --- Professional ---                        [phone], Esophagogastroduodenoscopy, flexible,                         transoral; diagnostic, including  collection of                         specimen(s) by brushing or washing, when performed                         (separate procedure) Diagnosis Code(s):     --- Professional ---                        K22.6, Gastro-esophageal laceration-hemorrhage syndrome                        K92.0, Hematemesis CPT copyright 2019 American Medical Association. All rights reserved. The codes documented in this report are preliminary and upon coder review may  be revised to meet current compliance requirements. Jonathon Bellows, MD Jonathon Bellows MD, MD 04/28/2019 11:00:58 AM This report has been signed electronically. Number of Addenda: 0 Note Initiated On:  04/28/2019 10:44 AM Estimated Blood Loss:  Estimated blood loss: none.      [facility]

## 2019-04-28 NOTE — Plan of Care (Signed)
  Problem: Education: Goal: Knowledge of General Education information will improve Description: Including pain rating scale, medication(s)/side effects and non-pharmacologic comfort measures Outcome: Progressing   Problem: Clinical Measurements: Goal: Ability to maintain clinical measurements within normal limits will improve Outcome: Progressing Goal: Will remain free from infection Outcome: Progressing Goal: Respiratory complications will improve Outcome: Progressing Goal: Cardiovascular complication will be avoided Outcome: Progressing   Problem: Activity: Goal: Risk for activity intolerance will decrease Outcome: Progressing   Problem: Pain Managment: Goal: General experience of comfort will improve Outcome: Progressing

## 2019-04-29 ENCOUNTER — Encounter: Payer: Self-pay | Admitting: *Deleted

## 2019-04-29 LAB — CBC
HCT: 26.1 % — ABNORMAL LOW (ref 39.0–52.0)
Hemoglobin: 8.8 g/dL — ABNORMAL LOW (ref 13.0–17.0)
MCH: 27.9 pg (ref 26.0–34.0)
MCHC: 33.7 g/dL (ref 30.0–36.0)
MCV: 82.9 fL (ref 80.0–100.0)
Platelets: 174 10*3/uL (ref 150–400)
RBC: 3.15 MIL/uL — ABNORMAL LOW (ref 4.22–5.81)
RDW: 14.3 % (ref 11.5–15.5)
WBC: 6.6 10*3/uL (ref 4.0–10.5)
nRBC: 0 % (ref 0.0–0.2)

## 2019-04-29 LAB — BASIC METABOLIC PANEL
Anion gap: 6 (ref 5–15)
BUN: 10 mg/dL (ref 8–23)
CO2: 25 mmol/L (ref 22–32)
Calcium: 8.4 mg/dL — ABNORMAL LOW (ref 8.9–10.3)
Chloride: 109 mmol/L (ref 98–111)
Creatinine, Ser: 0.97 mg/dL (ref 0.61–1.24)
GFR calc Af Amer: >60 mL/min (ref >60)
GFR calc non Af Amer: >60 mL/min (ref >60)
Glucose, Bld: 92 mg/dL (ref 70–99)
Potassium: 3.6 mmol/L (ref 3.5–5.1)
Sodium: 140 mmol/L (ref 135–145)

## 2019-04-29 NOTE — Progress Notes (Signed)
PROGRESS NOTE    Dale Palmer  [id number]  DOB: [birth year]  DOA: 04/26/2019 PCP: Baxter Hire, MD Outpatient Specialists:   Hospital course:  69 year old man with HTN and GERD was admitted 04/27/2019 with hematemesis.  He underwent EGD on 04/28/2019 which showed Mallory-Weiss tear requiring 4 clips.  He has been hemodynamically stable.   Subjective:  Patient feels well, is wondering when he can go home.  Is tolerating food without difficulty.  No further nausea or vomiting.  I explained to him he needs to get IV Protonix for 72 hours that he can go home which will be tomorrow.  Objective: Vitals:   04/29/19 0521 04/29/19 0741 04/29/19 1124 04/29/19 1530  BP: (!) 123/97 137/80 121/84 131/76  Pulse: 72 60 65 (!) 59  Resp:  16 17 16   Temp: 98.2 F (36.8 C) 98.6 F (37 C) 98.4 F (36.9 C) 98.3 F (36.8 C)  TempSrc: Oral Oral Oral Oral  SpO2: 100% 100% 100% 100%  Weight: 78.6 kg     Height:        Intake/Output Summary (Last 24 hours) at 04/29/2019 1705 Last data filed at 04/29/2019 1534 Gross per 24 hour  Intake 1540.56 ml  Output 1675 ml  Net -134.44 ml   Filed Weights   04/28/19 0405 04/28/19 1031 04/29/19 0521  Weight: 78.7 kg 78.7 kg 78.6 kg     Assessment & Plan:   Hematemesis Patient is hemodynamically stable with hemoglobin decreasing from 10.3 to 9 this morning Repeat hemoglobin this morning is 8.8 which is stable.  Pressure is also normal. Underwent EGD yesterday which showed nonbleeding Mallory-Weiss tear requiring 4 clips. Plan is to continue IV Protonix for 72 hours, can be discharged tomorrow.  HTN Hold lisinopril hydrochlorothiazide until blood pressure has normalized  BPH Continue Flomax    DVT prophylaxis: SCD Code Status: Full Family Communication: Patient's wife has been at bedside throughout Disposition Plan:   Patient is from: Home  Anticipated Discharge Location: Home  Barriers to Discharge: Needs IV Protonix for 72  hours  Is patient medically stable for Discharge: Will be discharged tomorrow.   Consultants:  GI  Procedures:  Status post EGD 04/28/2019  Antimicrobials:  None   Exam:  General: Well-appearing man lying in bed in no acute distress Eyes: sclera anicteric, conjuctiva mild injection bilaterally CVS: S1-S2, regular  Respiratory:  decreased air entry bilaterally secondary to decreased inspiratory effort, rales at bases  GI: NABS, soft, NT  LE: No edema.  Neuro: A/O x 3, Moving all extremities equally with normal strength, CN 3-12 intact, grossly nonfocal.  Psych: patient is logical and coherent, judgement and insight appear normal, mood and affect appropriate to situation.   Data Reviewed: Basic Metabolic Panel: Recent Labs  Lab 04/27/19 0005 04/28/19 0504 04/29/19 0448  NA 140 141 140  K 3.8 3.5 3.6  CL 107 109 109  CO2 27 26 25   GLUCOSE 131* 82 92  BUN 28* 21 10  CREATININE 1.48* 1.07 0.97  CALCIUM 8.6* 8.4* 8.4*   Liver Function Tests: Recent Labs  Lab 04/27/19 0005  AST 21  ALT 10  ALKPHOS 35*  BILITOT 1.2  PROT 6.2*  ALBUMIN 3.1*   No results for input(s): LIPASE, AMYLASE in the last 168 hours. No results for input(s): AMMONIA in the last 168 hours. CBC: Recent Labs  Lab 04/27/19 0005 04/27/19 0514 04/28/19 0504 04/29/19 0448  WBC 7.5  --  7.5 6.6  NEUTROABS 5.7  --   --   --  HGB 10.3* 10.2* 9.0* 8.8*  HCT 31.5*  --  27.1* 26.1*  MCV 84.5  --  83.4 82.9  PLT 210  --  178 174   Cardiac Enzymes: No results for input(s): CKTOTAL, CKMB, CKMBINDEX, TROPONINI in the last 168 hours. BNP (last 3 results) No results for input(s): PROBNP in the last 8760 hours. CBG: No results for input(s): GLUCAP in the last 168 hours.  Recent Results (from the past 240 hour(s))  Respiratory Panel by RT PCR (Flu A&B, Covid) - Nasopharyngeal Swab     Status: None   Collection Time: 04/27/19  1:39 AM   Specimen: Nasopharyngeal Swab  Result Value Ref Range  Status   SARS Coronavirus 2 by RT PCR NEGATIVE NEGATIVE Final    Comment: (NOTE) SARS-CoV-2 target nucleic acids are NOT DETECTED. The SARS-CoV-2 RNA is generally detectable in upper respiratoy specimens during the acute phase of infection. The lowest concentration of SARS-CoV-2 viral copies this assay can detect is 131 copies/mL. A negative result does not preclude SARS-Cov-2 infection and should not be used as the sole basis for treatment or other patient management decisions. A negative result may occur with  improper specimen collection/handling, submission of specimen other than nasopharyngeal swab, presence of viral mutation(s) within the areas targeted by this assay, and inadequate number of viral copies (<131 copies/mL). A negative result must be combined with clinical observations, patient history, and epidemiological information. The expected result is Negative. Fact Sheet for Patients:  PinkCheek.be Fact Sheet for Healthcare Providers:  GravelBags.it This test is not yet ap proved or cleared by the [place] FDA and  has been authorized for detection and/or diagnosis of SARS-CoV-2 by FDA under an Emergency Use Authorization (EUA). This EUA will remain  in effect (meaning this test can be used) for the duration of the COVID-19 declaration under Section 564(b)(1) of the Act, 21 U.S.C. section 360bbb-3(b)(1), unless the authorization is terminated or revoked sooner.    Influenza A by PCR NEGATIVE NEGATIVE Final   Influenza B by PCR NEGATIVE NEGATIVE Final    Comment: (NOTE) The Xpert Xpress SARS-CoV-2/FLU/RSV assay is intended as an aid in  the diagnosis of influenza from Nasopharyngeal swab specimens and  should not be used as a sole basis for treatment. Nasal washings and  aspirates are unacceptable for Xpert Xpress SARS-CoV-2/FLU/RSV  testing. Fact Sheet for  Patients: PinkCheek.be Fact Sheet for Healthcare Providers: GravelBags.it This test is not yet approved or cleared by the [place] FDA and  has been authorized for detection and/or diagnosis of SARS-CoV-2 by  FDA under an Emergency Use Authorization (EUA). This EUA will remain  in effect (meaning this test can be used) for the duration of the  Covid-19 declaration under Section 564(b)(1) of the Act, 21  U.S.C. section 360bbb-3(b)(1), unless the authorization is  terminated or revoked. Performed at [facility], [address], [place], [place] [address]       Studies: No results found.   Scheduled Meds: . pantoprazole (PROTONIX) IV  40 mg Intravenous Q12H  . sodium chloride flush  10 mL Intravenous Q12H  . tamsulosin  0.4 mg Oral QPC supper   Continuous Infusions:   Active Problems:   Hypertension   Hematemesis   Mallory-Weiss tear     Yomira Flitton Derek Jack, [facility]  If 7PM-7AM, please contact night-coverage [email or web address] Password [email or web address] 04/29/2019, 5:05 PM    LOS: 1 day

## 2019-04-30 MED ORDER — PANTOPRAZOLE SODIUM 20 MG PO TBEC: 20.0000 mg | DELAYED_RELEASE_TABLET | Freq: Every day | ORAL | 0 refills | Status: DC

## 2019-04-30 NOTE — Discharge Instructions (Signed)
Mallory-Weiss Syndrome  Mallory-Weiss syndrome refers to bleeding from tears in the lining of the tube that carries food from the mouth to the stomach (esophagus). The tears occur at the entrance to your stomach. Usually the bleeding stops by itself after 24-48 hours. In some cases, surgery may be needed. What are the causes? This condition may be caused by severe or long-lasting vomiting or coughing. What increases the risk? You are more likely to develop this condition if:  You abuse or drink too much alcohol.  You have certain eating disorders, such as eating a lot of food at once and then vomiting to prevent weight gain (bulimia). What are the signs or symptoms? Symptoms of this condition include:  Vomiting bright red or black, coffee-ground-like material.  Abdominal pain.  Having black, tarry stools.  Fainting or experiencing loss of consciousness. How is this diagnosed? This condition is diagnosed with an esophagogastroduodenoscopy (EGD). During an EGD procedure, a flexible tube (endoscope) is put into your mouth, passed through your esophagus into your stomach, and then into your small bowel. An EGD will show your health care provider where the tear is. How is this treated? Treatment for this condition depends on the severity of the tear or bleeding. It is necessary to stop any bleeding as soon as possible. Treatment includes:  Medicines to treat nausea.  Medicines to help the lining of your esophagus heal.  Having endoscopy to treat an area of bleeding with high heat (coagulation), injections, or surgical clips.  Receiving donated blood (transfusion) to replace lost blood. This is done in cases of severe bleeding.  Having a procedure that involves first doing an angiogram and then blocking blood flow to the bleeding site (embolization).  Having other surgical procedures if initial treatments do not control bleeding. In some cases, it may be necessary to treat any  conditions that may have caused the tear in the esophagus. This includes:  Treating long-lasting or recurrent vomiting or coughing.  Getting help for alcoholism or an eating disorder. Follow these instructions at home:  Take over-the-counter and prescription medicines only as told by your health care provider.  You may be told to stop taking aspirin or ibuprofen as these, and other medicines like them, can cause bleeding.  Return to your normal activities as told by your health care provider. Ask your health care provider what activities are safe for you.  Do not use any products that contain nicotine or tobacco, such as cigarettes and e-cigarettes. If you need help quitting, ask your health care provider.  Follow instructions from your health care provider about eating or drinking restrictions.  Do not drink alcohol.  Keep all follow-up visits as told by your health care provider. This is important. Contact a health care provider if you:  Have nausea or vomiting.  Have abdominal pain.  Have unexplained weight loss.  Need help to stop smoking or drinking alcohol. Get help right away if you:  Have dizziness that does not go away, or you are light-headed or faint.  Start vomiting again, or you have vomit that is bright red or looks like black coffee grounds.  Have bloody, black, or tarry stools.  Have chest pain.  Cannot eat or drink. Summary  Mallory-Weiss syndrome refers to bleeding from tears in the lining of the tube that carries food from the mouth to the stomach (esophagus).  Risk factors include severe or long-lasting coughing or vomiting, and heavy alcohol use.  Take over-the-counter and prescription medicines only as   told by your health care provider. You may have to avoid certain medicines that cause bleeding. This information is not intended to replace advice given to you by your health care provider. Make sure you discuss any questions you have with your  health care provider. Document Revised: 01/22/2017 Document Reviewed: 01/22/2017 Elsevier Patient Education  2020 Elsevier Inc.  

## 2019-04-30 NOTE — Discharge Summary (Signed)
Dale Palmer [id number] DOB: [birth year] DOA: 04/26/2019  PCP: Baxter Hire, MD  Admit date: 04/26/2019  Discharge date: 04/30/2019  Admitted From: Home   disposition: Home   Recommendations for Outpatient Follow-up:   Follow up with PCP in 1-2 weeks  PCP Please obtain CBC, 1week,    Home Health: None Equipment/Devices: None Consultations: Gastroenterology Discharge Condition: Improved CODE STATUS: Full Diet Recommendation: Heart Healthy   Diet Order            Diet - low sodium heart healthy        DIET SOFT Room service appropriate? Yes; Fluid consistency: Thin  Diet effective now               Chief Complaint  Patient presents with  . Hematemesis     Brief history of present illness from the day of admission and additional interim summary    Dale Palmer is a 69 y.o. male with medical history significant for Hypertension who presents to the emergency room with a single episode of emesis of bright red blood about a cupful.  Patient states he was in his usual state of health.  Said he was lying on the couch then felt nauseous started to belch and then vomited to be pure blood.  No prior history of gastric problems, liver disease, vomiting blood, dark or bloody stools.  He denied lightheadedness, palpitations or chest pain.  Denies abdominal pain or change in bowel habits   Patient was initially soft in the emergency room at 109/74 down to 93/60 7-1 16/78 with IV fluids not tachycardic.  Hemoglobin was 10.3.  Creatinine 1.48 with baseline 1.072 years prior.  Showed no acute disease.  Patient was typed and screened.  Patient was started on Protonix infusion.                                                                   Hospital Course     Patient was admitted for single episode of  hematemesis.  While his blood pressures were a little low initially, patient responded well to IV fluid resuscitation and remained hemodynamically stable throughout the rest of his stay.  He had no further episodes of hematemesis.    Patient was seen by gastroenterology and underwent an EGD which revealed a nonbleeding Mallory-Weiss tear which was clipped.  Patient completed 72 hours of IV Protonix and was discharged home to follow-up with his PCP for repeat CBC in 1 week.  Patient's hemoglobin was 10.3 on admission and dropped to 9.0 the following day and stabilized at 8.8 on day of discharge.  Patient's blood pressure was normalized and stayed normal for 48 hours prior to discharge.  On day of discharge patient was eager to go home.  States he felt well and  had no complaints.   Discharge diagnosis     Active Problems:   Hypertension   Hematemesis   Mallory-Weiss tear    Discharge instructions    Discharge Instructions    Diet - low sodium heart healthy   Complete by: As directed    Discharge instructions   Complete by: As directed    1. Take Pantoprazole 1 tablet a day until you see your PCP. You can discuss with them whether you need to keep on taking this medication.  2. Make an appointment to see your PCP in 1-2 weeks.      Discharge Medications   Allergies as of 04/30/2019   No Known Allergies     Medication List    TAKE these medications   aspirin EC 81 MG tablet TAKE 1 TABLET (81 MG TOTAL) BY MOUTH ONCE DAILY.   lisinopril-hydrochlorothiazide 20-25 MG tablet Commonly known as: ZESTORETIC Take 1 tablet by mouth daily.   pantoprazole 20 MG tablet Commonly known as: Protonix Take 1 tablet (20 mg total) by mouth daily.   tamsulosin 0.4 MG Caps capsule Commonly known as: FLOMAX TAKE 1 CAPSULE (0.4 MG TOTAL) BY MOUTH ONCE DAILY. TAKE 30 MINUTES AFTER SAME MEAL EACH DAY.   VITAMIN B-12 PO Take 1 tablet by mouth daily.       Follow-up Information    Baxter Hire, MD. Schedule an appointment as soon as possible for a visit in 1 week(s).   Specialty: Internal Medicine Contact information: [place] [address] [phone]           Major procedures and Radiology Reports - PLEASE review detailed and final reports thoroughly  -      DG Chest Port 1 View  Result Date: 04/27/2019 CLINICAL DATA:  Hematemesis. EXAM: PORTABLE CHEST 1 VIEW COMPARISON:  CT chest dated 10/22/2018 FINDINGS: The heart size and mediastinal contours are within normal limits. Both lungs are clear. The visualized skeletal structures are unremarkable. There are atherosclerotic changes of the thoracic aorta. IMPRESSION: No active disease. Electronically Signed   By: Constance Holster M.D.   On: 04/27/2019 00:19    Micro Results    Recent Results (from the past 240 hour(s))  Respiratory Panel by RT PCR (Flu A&B, Covid) - Nasopharyngeal Swab     Status: None   Collection Time: 04/27/19  1:39 AM   Specimen: Nasopharyngeal Swab  Result Value Ref Range Status   SARS Coronavirus 2 by RT PCR NEGATIVE NEGATIVE Final    Comment: (NOTE) SARS-CoV-2 target nucleic acids are NOT DETECTED. The SARS-CoV-2 RNA is generally detectable in upper respiratoy specimens during the acute phase of infection. The lowest concentration of SARS-CoV-2 viral copies this assay can detect is 131 copies/mL. A negative result does not preclude SARS-Cov-2 infection and should not be used as the sole basis for treatment or other patient management decisions. A negative result may occur with  improper specimen collection/handling, submission of specimen other than nasopharyngeal swab, presence of viral mutation(s) within the areas targeted by this assay, and inadequate number of viral copies (<131 copies/mL). A negative result must be combined with clinical observations, patient history, and epidemiological information. The expected result is Negative. Fact Sheet for  Patients:  PinkCheek.be Fact Sheet for Healthcare Providers:  GravelBags.it This test is not yet ap proved or cleared by the [place] FDA and  has been authorized for detection and/or diagnosis of SARS-CoV-2 by FDA under an Emergency Use Authorization (EUA). This EUA  will remain  in effect (meaning this test can be used) for the duration of the COVID-19 declaration under Section 564(b)(1) of the Act, 21 U.S.C. section 360bbb-3(b)(1), unless the authorization is terminated or revoked sooner.    Influenza A by PCR NEGATIVE NEGATIVE Final   Influenza B by PCR NEGATIVE NEGATIVE Final    Comment: (NOTE) The Xpert Xpress SARS-CoV-2/FLU/RSV assay is intended as an aid in  the diagnosis of influenza from Nasopharyngeal swab specimens and  should not be used as a sole basis for treatment. Nasal washings and  aspirates are unacceptable for Xpert Xpress SARS-CoV-2/FLU/RSV  testing. Fact Sheet for Patients: PinkCheek.be Fact Sheet for Healthcare Providers: GravelBags.it This test is not yet approved or cleared by the [place] FDA and  has been authorized for detection and/or diagnosis of SARS-CoV-2 by  FDA under an Emergency Use Authorization (EUA). This EUA will remain  in effect (meaning this test can be used) for the duration of the  Covid-19 declaration under Section 564(b)(1) of the Act, 21  U.S.C. section 360bbb-3(b)(1), unless the authorization is  terminated or revoked. Performed at [facility], [address], [place], [place] [address]     Today   Subjective    Dale Palmer today has no headache,no chest abdominal pain, no dizziness syncope or presyncope, feels much better wants to go home today.    Objective   Blood pressure 133/81, pulse 61, temperature 98.4 F (36.9 C), resp. rate 18, height 5\' 6"  (1.676 m), weight 78.6 kg, SpO2 100  %.   Intake/Output Summary (Last 24 hours) at 04/30/2019 0905 Last data filed at 04/30/2019 0419 Gross per 24 hour  Intake 840 ml  Output 1650 ml  Net -810 ml    Exam Awake Alert, Oriented x 3, No new F.N deficits, Normal affect [place].AT,PERRAL Supple Neck,No JVD, No cervical lymphadenopathy appriciated.  Symmetrical Chest wall movement, Good air movement bilaterally, CTAB RRR,No Gallops,Rubs or new Murmurs, No Parasternal Heave +ve B.Sounds, Abd Soft, Non tender, No organomegaly appriciated, No rebound -guarding or rigidity. No Cyanosis, Clubbing or edema, No new Rash or bruise   Data Review   CBC w Diff:  Lab Results  Component Value Date   WBC 6.6 04/29/2019   HGB 8.8 (L) 04/29/2019   HCT 26.1 (L) 04/29/2019   PLT 174 04/29/2019   LYMPHOPCT 15 04/27/2019   MONOPCT 6 04/27/2019   EOSPCT 3 04/27/2019   BASOPCT 0 04/27/2019    CMP:  Lab Results  Component Value Date   NA 140 04/29/2019   K 3.6 04/29/2019   CL 109 04/29/2019   CO2 25 04/29/2019   BUN 10 04/29/2019   CREATININE 0.97 04/29/2019   PROT 6.2 (L) 04/27/2019   ALBUMIN 3.1 (L) 04/27/2019   BILITOT 1.2 04/27/2019   ALKPHOS 35 (L) 04/27/2019   AST 21 04/27/2019   ALT 10 04/27/2019  .   Total Time in preparing paper work, data evaluation and todays exam - 35 minutes  Vashti Hey M.D on 04/30/2019 at [facility]   Office  [phone]

## 2019-05-29 ENCOUNTER — Encounter: Payer: Self-pay | Admitting: Urology

## 2019-05-29 ENCOUNTER — Other Ambulatory Visit: Payer: BC Managed Care – PPO

## 2019-10-17 ENCOUNTER — Telehealth: Payer: Self-pay | Admitting: *Deleted

## 2019-10-17 NOTE — Telephone Encounter (Signed)
(  10/17/2019) Unable to leave message for pt to notify them that it is time to schedule annual low dose lung cancer screening CT scan. Will call back to verify information prior to the scan being scheduled SRW

## 2019-10-29 ENCOUNTER — Other Ambulatory Visit: Payer: Self-pay | Admitting: *Deleted

## 2019-10-29 DIAGNOSIS — Z87891 Personal history of nicotine dependence: Secondary | ICD-10-CM

## 2019-10-29 DIAGNOSIS — Z122 Encounter for screening for malignant neoplasm of respiratory organs: Secondary | ICD-10-CM

## 2019-10-29 NOTE — Progress Notes (Signed)
Current smoker, 34.5 pack year

## 2019-11-05 ENCOUNTER — Other Ambulatory Visit: Payer: Self-pay

## 2019-11-05 ENCOUNTER — Ambulatory Visit
Admission: RE | Admit: 2019-11-05 | Discharge: 2019-11-05 | Disposition: A | Discharge status: Home / Self Care | Payer: BC Managed Care – PPO | Source: Ambulatory Visit | Attending: Oncology | Admitting: Oncology

## 2019-11-05 DIAGNOSIS — Z87891 Personal history of nicotine dependence: Secondary | ICD-10-CM | POA: Diagnosis present

## 2019-11-05 DIAGNOSIS — Z122 Encounter for screening for malignant neoplasm of respiratory organs: Secondary | ICD-10-CM | POA: Diagnosis present

## 2019-11-09 ENCOUNTER — Encounter: Payer: Self-pay | Admitting: *Deleted

## 2020-11-29 IMAGING — CT CT CHEST LUNG CANCER SCREENING LOW DOSE W/O CM
2 of 5 series · 15 of 40 positions shown, 18 images · non-contrast
Comparison: Low-dose lung cancer screening CT chest dated
10/22/2018

CLINICAL DATA: 69-year-old male current smoker, with 35 pack-year
history of smoking, for follow-up lung cancer screening

EXAM:
CT CHEST WITHOUT CONTRAST LOW-DOSE FOR LUNG CANCER SCREENING
TECHNIQUE: Multidetector CT imaging of the chest was performed following the
standard protocol without IV contrast.

[Series 3: lung 1.00 · axial · 0.65mm/px · z∈[-1215,-907]mm · 12 of 342 slices shown, 15 images]
[im 17/342  mediastinal]
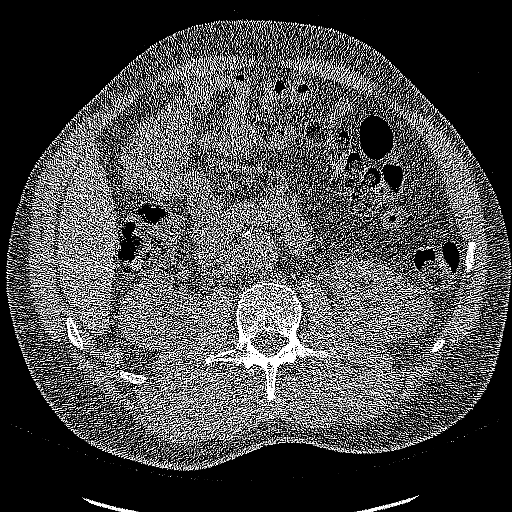
[im 17/342  lung]
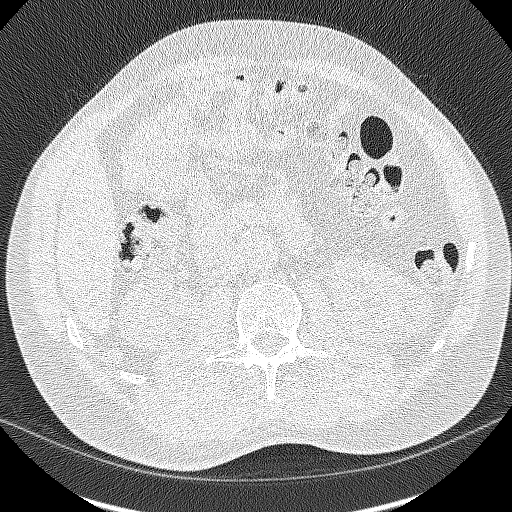
[im 49/342  lung]
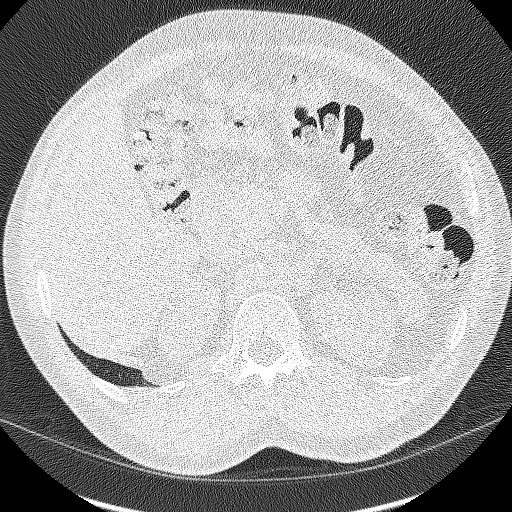
[im 82/342  lung]
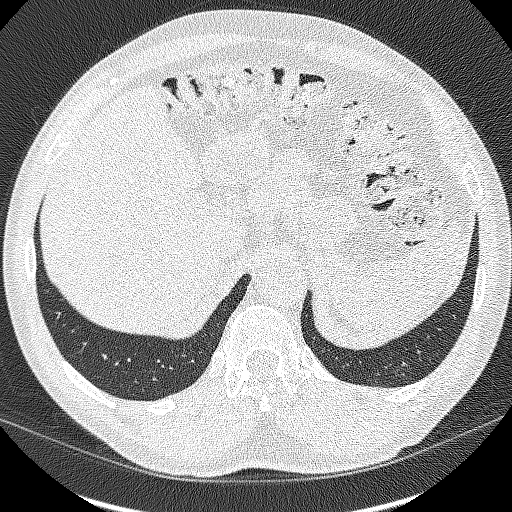
[im 98/342  lung]
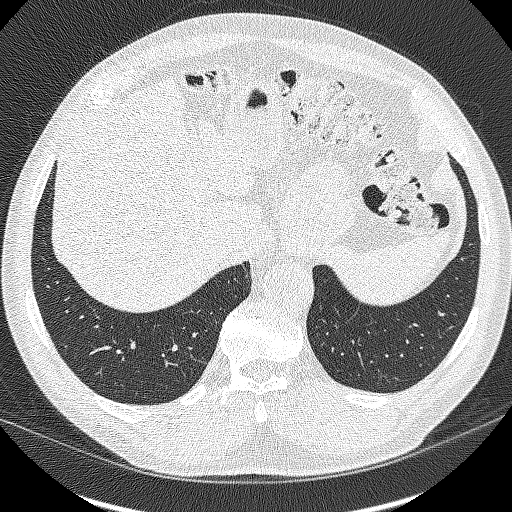
[im 130/342  mediastinal]
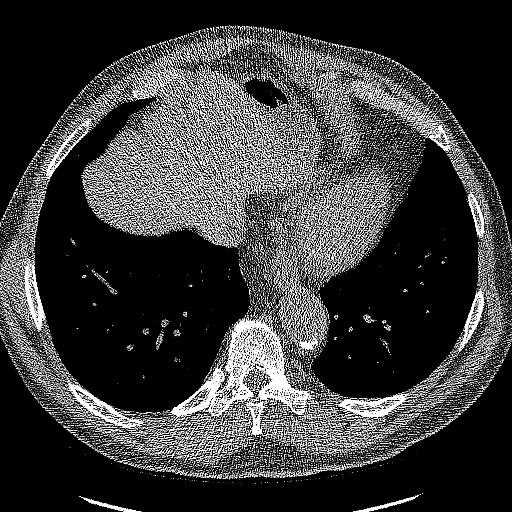
[im 130/342  lung]
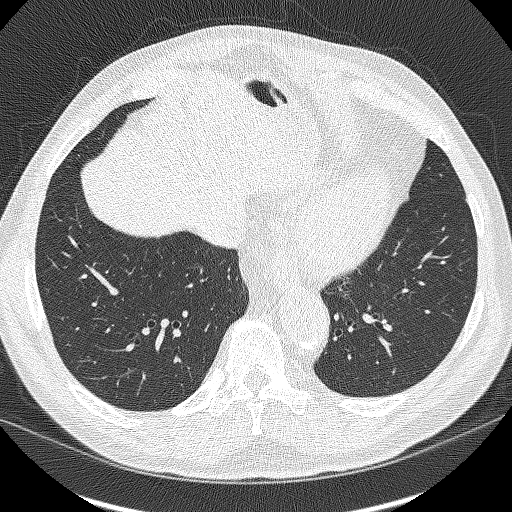
[im 163/342  lung]
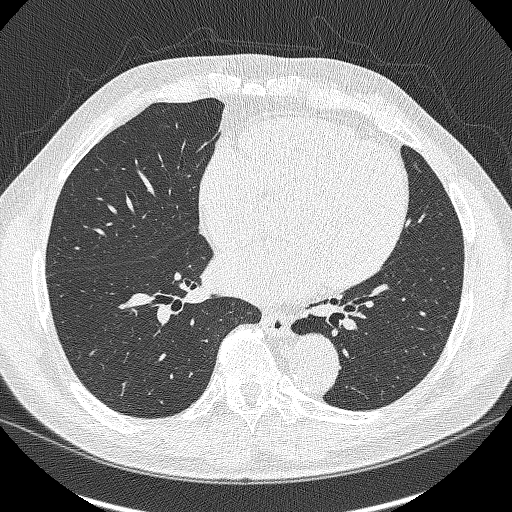
[im 179/342  lung]
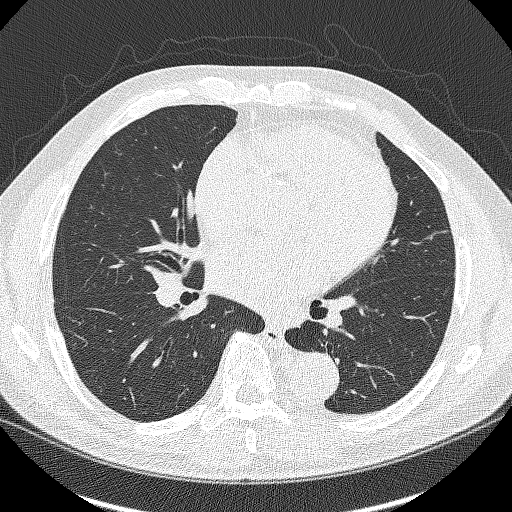
[im 212/342  lung]
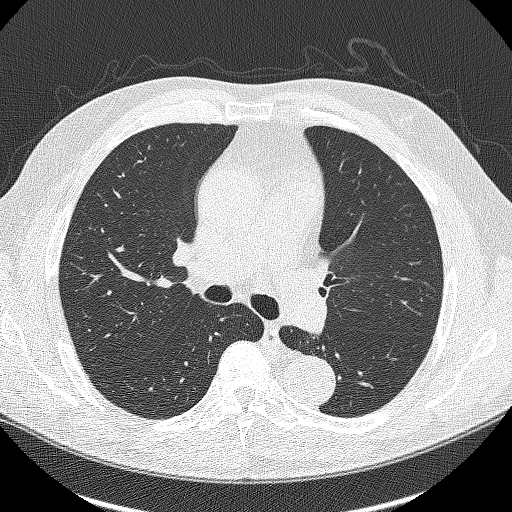
[im 244/342  mediastinal]
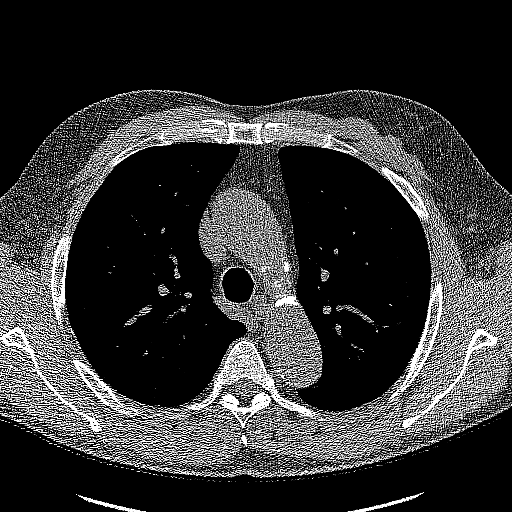
[im 244/342  lung]
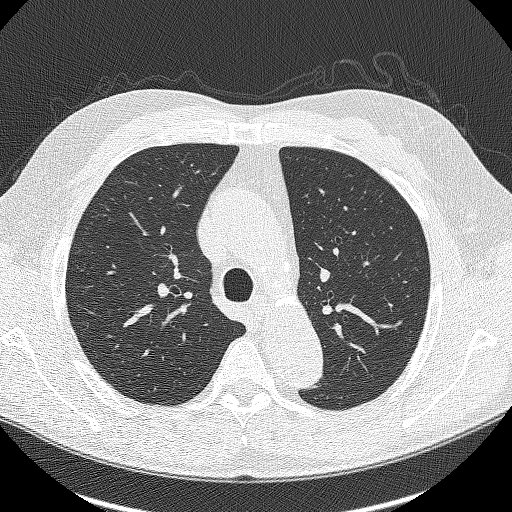
[im 260/342  lung]
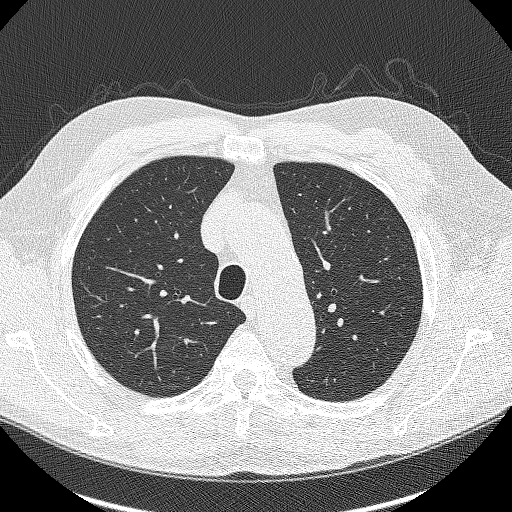
[im 293/342  lung]
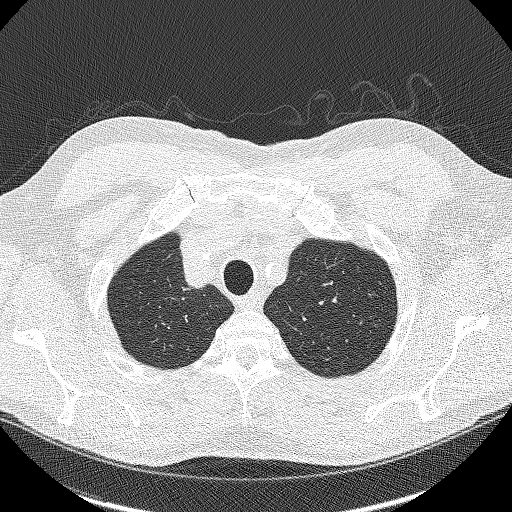
[im 325/342  lung]
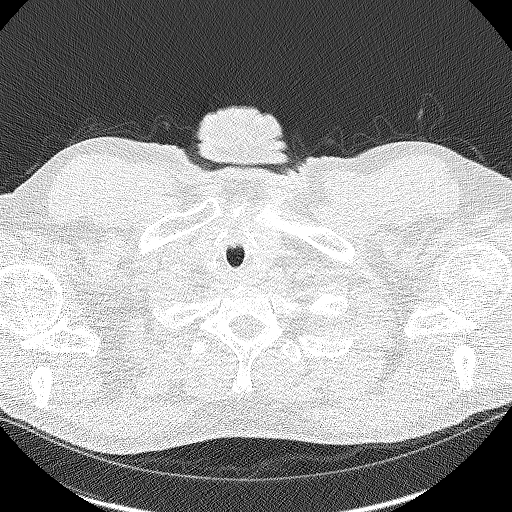

[Series 4: coronals lung 1.00 cor · coronal · 0.65mm/px · 3 of 298 slices shown]
[im 60/298  lung]
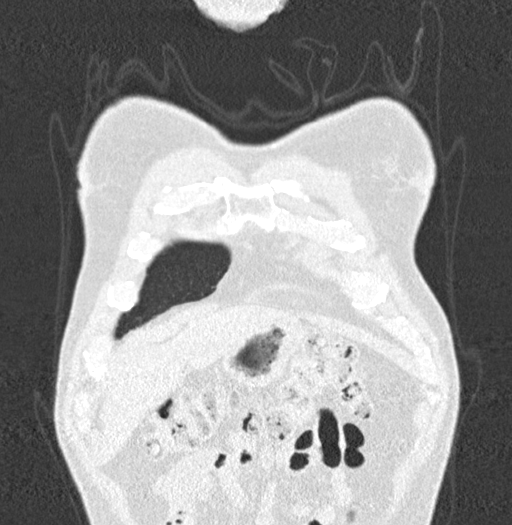
[im 119/298  lung]
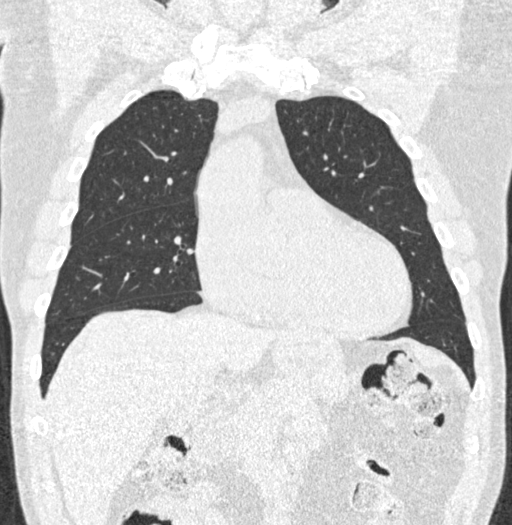
[im 179/298  lung]
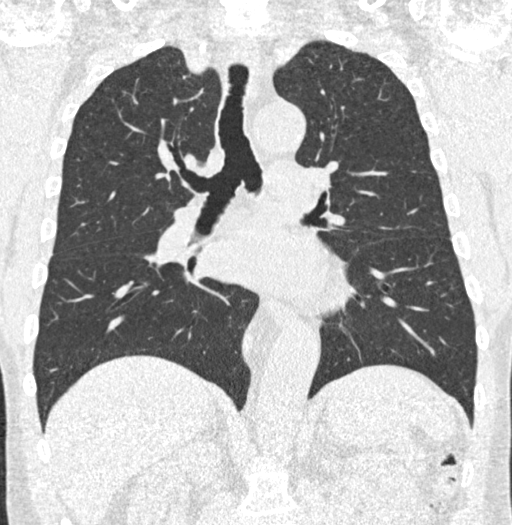

[15 of 40 positions shown; findings below may reference images not displayed]

FINDINGS: Cardiovascular: Heart is normal in size.  No pericardial effusion.

No evidence of thoracic aortic aneurysm. Atherosclerotic
calcifications of the aortic arch.

Coronary atherosclerosis the LAD and left circumflex.

Mediastinum/Nodes: No suspicious mediastinal lymphadenopathy.

Visualized thyroid is unremarkable.

Lungs/Pleura: Lungs are essentially clear.

No suspicious pulmonary nodules.

Minimal lingular scarring. Minimal linear scarring in the medial
left lower lobe.

No focal consolidation.

No pleural effusion or pneumothorax.

Upper Abdomen: Visualized upper abdomen is notable for bilateral
renal cysts measuring up to 3.1 cm on the left and vascular
calcifications.

Musculoskeletal: Degenerative changes of the lower thoracic spine.
IMPRESSION: Lung-RADS 1, negative. Continue annual screening with low-dose chest
CT without contrast in 12 months.

Aortic Atherosclerosis ([id number].P).

## 2021-03-23 ENCOUNTER — Other Ambulatory Visit: Payer: Self-pay

## 2021-03-23 DIAGNOSIS — Z87891 Personal history of nicotine dependence: Secondary | ICD-10-CM

## 2021-03-23 DIAGNOSIS — F17210 Nicotine dependence, cigarettes, uncomplicated: Secondary | ICD-10-CM

## 2021-04-04 ENCOUNTER — Other Ambulatory Visit: Payer: Self-pay

## 2021-04-04 ENCOUNTER — Ambulatory Visit
Admission: RE | Admit: 2021-04-04 | Discharge: 2021-04-04 | Disposition: A | Discharge status: Home / Self Care | Payer: BC Managed Care – PPO | Source: Ambulatory Visit | Attending: Acute Care | Admitting: Acute Care

## 2021-04-04 DIAGNOSIS — Z87891 Personal history of nicotine dependence: Secondary | ICD-10-CM | POA: Insufficient documentation

## 2021-04-04 DIAGNOSIS — F17210 Nicotine dependence, cigarettes, uncomplicated: Secondary | ICD-10-CM | POA: Diagnosis present

## 2021-04-06 ENCOUNTER — Telehealth: Payer: Self-pay | Admitting: Acute Care

## 2021-04-06 DIAGNOSIS — Z87891 Personal history of nicotine dependence: Secondary | ICD-10-CM

## 2021-04-06 DIAGNOSIS — F17210 Nicotine dependence, cigarettes, uncomplicated: Secondary | ICD-10-CM

## 2021-04-06 NOTE — Telephone Encounter (Signed)
Spoke with patient by phone to review LDCT results.  Emphysema and atherosclerosis, as previous.  Patient is not on statin medication.  Hiatal hernia.  Patient was not aware and has no expressed concerns or symptoms. Results faxed to PCP. Patient will review with PCP next visit.  No further questions. ?

## 2022-04-05 ENCOUNTER — Ambulatory Visit
Admission: RE | Admit: 2022-04-05 | Discharge: 2022-04-05 | Disposition: A | Discharge status: Home / Self Care | Payer: BC Managed Care – PPO | Source: Ambulatory Visit | Attending: Acute Care | Admitting: Acute Care

## 2022-04-05 DIAGNOSIS — Z122 Encounter for screening for malignant neoplasm of respiratory organs: Secondary | ICD-10-CM | POA: Diagnosis present

## 2022-04-05 DIAGNOSIS — F17210 Nicotine dependence, cigarettes, uncomplicated: Secondary | ICD-10-CM | POA: Diagnosis not present

## 2022-04-05 DIAGNOSIS — I2510 Atherosclerotic heart disease of native coronary artery without angina pectoris: Secondary | ICD-10-CM | POA: Diagnosis not present

## 2022-04-05 DIAGNOSIS — J439 Emphysema, unspecified: Secondary | ICD-10-CM | POA: Insufficient documentation

## 2022-04-05 DIAGNOSIS — I700 Atherosclerosis of aorta: Secondary | ICD-10-CM | POA: Insufficient documentation

## 2022-04-05 DIAGNOSIS — Z87891 Personal history of nicotine dependence: Secondary | ICD-10-CM

## 2022-04-09 ENCOUNTER — Other Ambulatory Visit: Payer: Self-pay | Admitting: Acute Care

## 2022-04-09 DIAGNOSIS — Z87891 Personal history of nicotine dependence: Secondary | ICD-10-CM

## 2022-04-09 DIAGNOSIS — Z122 Encounter for screening for malignant neoplasm of respiratory organs: Secondary | ICD-10-CM

## 2022-04-09 DIAGNOSIS — F17210 Nicotine dependence, cigarettes, uncomplicated: Secondary | ICD-10-CM

## 2022-08-29 ENCOUNTER — Encounter: Payer: Self-pay | Admitting: Urology

## 2022-08-29 ENCOUNTER — Ambulatory Visit: Payer: BC Managed Care – PPO | Admitting: Urology

## 2022-08-29 VITALS — BP 96/70 | HR 74 | Wt 183.0 lb

## 2022-08-29 DIAGNOSIS — R310 Gross hematuria: Secondary | ICD-10-CM | POA: Diagnosis not present

## 2022-08-29 DIAGNOSIS — Z716 Tobacco abuse counseling: Secondary | ICD-10-CM

## 2022-08-29 DIAGNOSIS — C61 Malignant neoplasm of prostate: Secondary | ICD-10-CM | POA: Diagnosis not present

## 2022-08-29 DIAGNOSIS — N401 Enlarged prostate with lower urinary tract symptoms: Secondary | ICD-10-CM

## 2022-08-29 DIAGNOSIS — R972 Elevated prostate specific antigen [PSA]: Secondary | ICD-10-CM

## 2022-08-29 DIAGNOSIS — R350 Frequency of micturition: Secondary | ICD-10-CM

## 2022-08-29 LAB — URINALYSIS, COMPLETE
Bilirubin, UA: NEGATIVE
Glucose, UA: NEGATIVE
Ketones, UA: NEGATIVE
Leukocytes,UA: NEGATIVE
Nitrite, UA: NEGATIVE
Protein,UA: NEGATIVE
Specific Gravity, UA: >1.03 — ABNORMAL HIGH (ref 1.005–1.030)
Urobilinogen, Ur: 1 mg/dL (ref 0.2–1.0)
pH, UA: 5.5 (ref 5.0–7.5)

## 2022-08-29 LAB — MICROSCOPIC EXAMINATION

## 2022-08-29 NOTE — Progress Notes (Signed)
I, Dale Palmer,acting as a scribe for Dale Scotland, MD.,have documented all relevant documentation on the behalf of Dale Scotland, MD,as directed by  Dale Scotland, MD while in the presence of Dale Scotland, MD.   08/29/22 7:15 PM   Dale Palmer [birth year] [id number]  Referring provider: Gracelyn Nurse, MD [address] [place],  [place] [address]  Chief Complaint  Patient presents with   Hematuria    HPI: 72 year-old male who is referred back for further evaluation of gross hematuria. He was last seen by me in 2021 for a history of fluctuating PSA as well as prostate cancer, low risk on active surveillance.  Biopsy performed on 01/08/2019 reveals a single core of Gleason 3+3 at the left apex approximately 5% of the tissue.  TRUS vol 62.4.   He also has a personal history of BPH previously managed on Flomax.   He was referred back by his primary care, Dr. Letitia Palmer, after he was seen and evaluated in July, noting several episodes of painless gross hematuria. He's on a baby aspirin with no other anticoagulants. He did have a urinalysis that showed 13 red blood cells per high powered field, but otherwise was unremarkable, confirming the presence of blood. He's not had any recent cross-sectional imaging other than lung cancer screening. 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  He reports episodes of gross hematuria occurring a couple of weeks ago, which lasted for a day and then resolved. He denies any associated pain or clots. He attributes the hematuria to drinking Pepsi, which he has since stopped consuming. He has not taken Flomax for approximately a year due to his work schedule. He does not notice any significant difference in his urinary symptoms since discontinuing the medication. He continues  to smoke half a pack of cigarettes per day. He has not had recent blood work or imaging for his prostate cancer surveillance.  PSA trend: 5.20 on 11/01/2020 4.83 on 05/05/2019 6.1 on 12/10/2018 5.5  on 09/04/2018 3.68 on 03/2017 4.38 on 08/2016 3.55 on 02/2016 5.95 on 08/2015 3.27 on 08/2013  Results for orders placed or performed in visit on 08/29/22  Microscopic Examination   Urine  Result Value Ref Range   WBC, UA 0-5 0 - 5 /hpf   RBC, Urine 11-30 (A) 0 - 2 /hpf   Epithelial Cells (non renal) 0-10 0 - 10 /hpf   Bacteria, UA Few None seen/Few  Urinalysis, Complete  Result Value Ref Range   Specific Gravity, UA >1.030 (H) 1.005 - 1.030   pH, UA 5.5 5.0 - 7.5   Color, UA Yellow Yellow   Appearance Ur Clear Clear   Leukocytes,UA Negative Negative   Protein,UA Negative Negative/Trace   Glucose, UA Negative Negative   Ketones, UA Negative Negative   RBC, UA 2+ (A) Negative   Bilirubin, UA Negative Negative   Urobilinogen, Ur 1.0 0.2 - 1.0 mg/dL   Nitrite, UA Negative Negative   Microscopic Examination See below:      PMH: Past Medical History:  Diagnosis Date   Anemia    B12 deficiency    ED (erectile dysfunction) 07/12/2017   Elevated PSA, less than 10 ng/ml 09/22/2015   Erectile dysfunction    Hypertension    Tobacco abuse 10/11/2016    Surgical History: Past Surgical History:  Procedure  Laterality Date   ESOPHAGOGASTRODUODENOSCOPY (EGD) WITH PROPOFOL N/A 04/28/2019   Procedure: ESOPHAGOGASTRODUODENOSCOPY (EGD) WITH PROPOFOL;  Surgeon: Dale Mood, MD;  Location: [facility] ENDOSCOPY;  Service: Gastroenterology;  Laterality: N/A;   INGUINAL HERNIA REPAIR Bilateral 08/24/2017   Procedure: LAPAROSCOPIC INGUINAL HERNIA, bilateral;  Surgeon: Dale Haw, MD;  Location: [facility] ORS;  Service: General;  Laterality: Bilateral;   none      Home Medications:  Allergies as of 08/29/2022   No Known Allergies      Medication List        Accurate as of August 29, 2022  7:15 PM. If  you have any questions, ask your Palmer or doctor.          aspirin EC 81 MG tablet TAKE 1 TABLET (81 MG TOTAL) BY MOUTH ONCE DAILY.   lisinopril-hydrochlorothiazide 20-25 MG tablet Commonly known as: ZESTORETIC Take 1 tablet by mouth daily.   pantoprazole 20 MG tablet Commonly known as: Protonix Take 1 tablet (20 mg total) by mouth daily.   tamsulosin 0.4 MG Caps capsule Commonly known as: FLOMAX TAKE 1 CAPSULE (0.4 MG TOTAL) BY MOUTH ONCE DAILY. TAKE 30 MINUTES AFTER SAME MEAL EACH DAY.   VITAMIN B-12 PO Take 1 tablet by mouth daily.        Family History: Family History  Problem Relation Age of Onset   Prostate cancer Neg Hx    Kidney cancer Neg Hx     Social History:  reports that he has been smoking cigarettes. He has a 22 pack-year smoking history. He has never used smokeless tobacco. He reports current alcohol use. He reports that he does not use drugs.   Physical Exam: BP 96/70   Pulse 74   Wt 183 lb (83 kg)   BMI 29.54 kg/m   Constitutional:  Alert and oriented, No acute distress. HEENT:  AT, moist mucus membranes.  Trachea midline, no masses. GU: enlarged prostate with a rubbery nodule at the left apex. Neurologic: Grossly intact, no focal deficits, moving all 4 extremities. Psychiatric: Normal Palmer and affect.  Urinalysis    Component Value Date/Time   APPEARANCEUR Clear 08/29/2022 1438   GLUCOSEU Negative 08/29/2022 1438   BILIRUBINUR Negative 08/29/2022 1438   PROTEINUR Negative 08/29/2022 1438   NITRITE Negative 08/29/2022 1438   LEUKOCYTESUR Negative 08/29/2022 1438    Lab Results  Component Value Date   LABMICR See below: 08/29/2022   WBCUA 0-5 08/29/2022   RBCUA 3-10 (A) 11/06/2017   LABEPIT 0-10 08/29/2022   MUCUS Present (A) 11/06/2017   BACTERIA Few 08/29/2022    Assessment & Plan:    Gross/microscopic hematuria -Would recommend repeat hematuria evaluation, especially in light of his smoking history. -Recommend CT urogram  and cystoscopy.   Prostate cancer -Previously on active surveillance, been lost to follow-up -His PSA was last checked two years ago, would recommend another PSA. -Importance of at minimum annual follow up is recommended.   3. BPH with urinary frequency -Previously managed on Tamsulosin, which he has discontinued. -Discuss the potential need to restart Tamsulosin based on symptoms and exam findings.  4. Smoking cessation -Increased risk for bladder cancer and other malignancies. -Counsel on smoking cessation and discuss the risks associated with continued smoking.  Return for CT urogram, cystoscopy, PSA.   [facility] [address], [address] [place], [place] [address] [phone]

## 2022-08-29 NOTE — Patient Instructions (Signed)

## 2022-09-20 ENCOUNTER — Ambulatory Visit: Payer: BC Managed Care – PPO | Admitting: Urology

## 2022-09-20 VITALS — BP 135/82 | HR 57 | Ht 66.0 in | Wt 183.2 lb

## 2022-09-20 DIAGNOSIS — R310 Gross hematuria: Secondary | ICD-10-CM

## 2022-09-20 DIAGNOSIS — C61 Malignant neoplasm of prostate: Secondary | ICD-10-CM

## 2022-09-20 DIAGNOSIS — Z8546 Personal history of malignant neoplasm of prostate: Secondary | ICD-10-CM

## 2022-09-20 LAB — MICROSCOPIC EXAMINATION

## 2022-09-20 LAB — URINALYSIS, COMPLETE
Bilirubin, UA: NEGATIVE
Glucose, UA: NEGATIVE
Ketones, UA: NEGATIVE
Leukocytes,UA: NEGATIVE
Nitrite, UA: NEGATIVE
Protein,UA: NEGATIVE
Specific Gravity, UA: 1.025 (ref 1.005–1.030)
Urobilinogen, Ur: 1 mg/dL (ref 0.2–1.0)
pH, UA: 6.5 (ref 5.0–7.5)

## 2022-09-20 NOTE — Progress Notes (Signed)
   09/20/22  CC:  Chief Complaint  Patient presents with   Cysto    HPI: 72 year old male with a personal history of gross and microscopic hematuria who presents today for cystoscopy.  Unfortunately in the interim, he did not have a CT urogram.  He states he was never called.  He reports no further episodes of gross hematuria since the last visit.  He does have 11-30 red blood cells per high-powered field in his urine today, otherwise negative.  He also has a personal history of low risk prostate cancer active surveillance whose been lost to follow-up in the interim.  His most recent PSA has risen somewhat to 7.3, previously as high as 6.1 but has been checked in 3 years.  Ongoing surveillance is recommended for this.  Blood pressure 135/82, pulse (!) 57, height 5\' 6"  (1.676 m), weight 183 lb 4 oz (83.1 kg). NED. A&Ox3.   No respiratory distress   Abd soft, NT, ND Normal phallus with bilateral descended testicles  Cystoscopy Procedure Note  Patient identification was confirmed, informed consent was obtained, and patient was prepped using Betadine solution.  Lidocaine jelly was administered per urethral meatus.     Pre-Procedure: - Inspection reveals a normal caliber ureteral meatus.  Procedure: The flexible cystoscope was introduced without difficulty - No urethral strictures/lesions are present. - Enlarged prostate  - Elevated bladder neck - Bilateral ureteral orifices identified - Bladder mucosa  reveals no ulcers, tumors, or lesions - No bladder stones -Mild trabeculation with a [place] diverticulum at the dome along with a few saccules  Retroflexion shows intravesical protrusion of kissing lateral lobes  Some mild bleeding noted arising from the prostate with cystoscopic manipulation.   Post-Procedure: - Patient tolerated the procedure well  Assessment/ Plan:  1. Gross hematuria Suspected source likely prostatic in origin however has not completed hematuria  workup including CT urogram  Scheduling of this was facilitated today in the office, follow-up with results he understands the need for upper tract imaging the importance of this - Urinalysis, Complete  2. Prostate cancer [facility]) Personal history of prostate cancer, PSA has risen slightly over 1 point but over 3-year interval which may be appropriate  Will continue to trend along with collect data points  Will have him return in about 3 months for another PSA recheck discussed the need for further diagnostic workup and/or imaging.  He is agreeable this plan.  Follow-up in 3 months with PSA- call with CT urogram results - PSA; Future   [name], MD

## 2022-09-20 NOTE — Patient Instructions (Signed)
You are scheduled for CT Scan on 09/29/22 at :30 pm Please arrive at 1 pm [facility] Entrance at [facility] ([address]).  Please DO NOT Eat or Drink 4 hours prior to your appointment.

## 2022-09-29 ENCOUNTER — Ambulatory Visit: Payer: BC Managed Care – PPO

## 2022-10-05 ENCOUNTER — Ambulatory Visit
Admission: RE | Admit: 2022-10-05 | Discharge: 2022-10-05 | Disposition: A | Discharge status: Home / Self Care | Payer: BC Managed Care – PPO | Source: Ambulatory Visit | Attending: Urology | Admitting: Urology

## 2022-10-05 DIAGNOSIS — R350 Frequency of micturition: Secondary | ICD-10-CM | POA: Diagnosis present

## 2022-10-05 DIAGNOSIS — N401 Enlarged prostate with lower urinary tract symptoms: Secondary | ICD-10-CM

## 2022-10-05 DIAGNOSIS — C61 Malignant neoplasm of prostate: Secondary | ICD-10-CM | POA: Diagnosis present

## 2022-10-05 DIAGNOSIS — R310 Gross hematuria: Secondary | ICD-10-CM

## 2022-10-05 DIAGNOSIS — R972 Elevated prostate specific antigen [PSA]: Secondary | ICD-10-CM

## 2022-10-05 LAB — POCT I-STAT CREATININE: Creatinine, Ser: 1.4 mg/dL — ABNORMAL HIGH (ref 0.61–1.24)

## 2022-10-05 MED ORDER — IOHEXOL 300 MG/ML  SOLN
100.0000 mL | Freq: Once | INTRAMUSCULAR | Status: AC | PRN
  Administered 2022-10-05: 100 mL via INTRAVENOUS

## 2022-12-14 ENCOUNTER — Other Ambulatory Visit: Payer: BC Managed Care – PPO

## 2022-12-14 DIAGNOSIS — C61 Malignant neoplasm of prostate: Secondary | ICD-10-CM

## 2022-12-15 LAB — PSA: Prostate Specific Ag, Serum: 7.3 ng/mL — ABNORMAL HIGH (ref 0.0–4.0)

## 2022-12-19 ENCOUNTER — Encounter: Payer: Self-pay | Admitting: Urology

## 2022-12-19 ENCOUNTER — Ambulatory Visit: Payer: BC Managed Care – PPO | Admitting: Urology

## 2022-12-19 VITALS — BP 99/82 | HR 149 | Ht 70.0 in | Wt 183.0 lb

## 2022-12-19 DIAGNOSIS — Z8546 Personal history of malignant neoplasm of prostate: Secondary | ICD-10-CM

## 2022-12-19 DIAGNOSIS — Z87898 Personal history of other specified conditions: Secondary | ICD-10-CM

## 2022-12-19 DIAGNOSIS — N400 Enlarged prostate without lower urinary tract symptoms: Secondary | ICD-10-CM | POA: Diagnosis not present

## 2022-12-19 NOTE — Progress Notes (Incomplete)
Marcelle Overlie Plume,acting as a scribe for [name], MD.,have documented all relevant documentation on the behalf of [name], MD,as directed by  [name], MD while in the presence of [name], MD.  12/19/22  1:55 PM   Loreli Dollar [birth year] [id number]  Referring provider: Gracelyn Nurse, MD [address] [facility] [address] [facility] [place],  [place] [address]  Chief Complaint  Patient presents with   Elevated PSA    HPI: 72 year-old male who presents today for follow up.   He has a personal history of prostate cancer, Gleason 3+3, involving a single core, diagnosed on biopsy on 01/08/2019, 5% of the tissue. TRUS volume at the time was 64.   He also recently underwent a microscopic hematuria workup, including cystoscopy that showed prostatomegaly but no GU pathology. In the interim, he has also undergone his CT hematuria workup study completed on 10/20/2022, which also shows prostatomegaly but no other pathology. Prostate exam was benign and rubbery with no concern for nodularity.   His most recent PSA on 12/14/2022 was 7.3 and remains stably elevated. Prior to that, he did not have his PSA checked for 2 years.   Today, he reports no new symptoms and denies any urinary problems.   PSA trend: 7.3 on 12/14/2022 7.3 on 08/29/2022 5.20 on 11/01/2020 4.83 on 05/05/2019 6.1 on 12/10/2018 5.5  on 09/04/2018 3.68 on 03/2017 4.38 on 08/2016 3.55 on 02/2016 5.95 on 08/2015 3.27 on 08/2013  PMH: Past Medical History:  Diagnosis Date   Anemia    B12 deficiency    ED (erectile dysfunction) 07/12/2017   Elevated PSA, less than 10 ng/ml 09/22/2015   Erectile dysfunction    Hypertension    Tobacco abuse 10/11/2016    Surgical History: Past Surgical History:  Procedure Laterality Date   ESOPHAGOGASTRODUODENOSCOPY (EGD) WITH PROPOFOL N/A 04/28/2019   Procedure: ESOPHAGOGASTRODUODENOSCOPY (EGD) WITH PROPOFOL;  Surgeon: Wyline Mood, MD;  Location: [facility] ENDOSCOPY;   Service: Gastroenterology;  Laterality: N/A;   INGUINAL HERNIA REPAIR Bilateral 08/24/2017   Procedure: LAPAROSCOPIC INGUINAL HERNIA, bilateral;  Surgeon: Lattie Haw, MD;  Location: [facility] ORS;  Service: General;  Laterality: Bilateral;   none      Home Medications:  Allergies as of 12/19/2022   No Known Allergies      Medication List        Accurate as of December 19, 2022  1:55 PM. If you have any questions, ask your nurse or doctor.          STOP taking these medications    pantoprazole 20 MG tablet Commonly known as: Protonix Stopped by: [name]       TAKE these medications    aspirin EC 81 MG tablet TAKE 1 TABLET (81 MG TOTAL) BY MOUTH ONCE DAILY.   lisinopril-hydrochlorothiazide 20-25 MG tablet Commonly known as: ZESTORETIC Take 1 tablet by mouth daily.   tamsulosin 0.4 MG Caps capsule Commonly known as: FLOMAX TAKE 1 CAPSULE (0.4 MG TOTAL) BY MOUTH ONCE DAILY. TAKE 30 MINUTES AFTER SAME MEAL EACH DAY.   VITAMIN B-12 PO Take 1 tablet by mouth daily.         Family History: Family History  Problem Relation Age of Onset   Prostate cancer Neg Hx    Kidney cancer Neg Hx     Social History:  reports that he has been smoking cigarettes. He has a 22 pack-year smoking history. He has never used smokeless tobacco. He reports current alcohol use. He  reports that he does not use drugs.   Physical Exam: BP 99/82   Pulse (!) 149   Ht 5\' 10"  (1.778 m)   Wt 183 lb (83 kg)   BMI 26.26 kg/m   Constitutional:  Alert and oriented, No acute distress. HEENT: [place] AT, moist mucus membranes.  Trachea midline, no masses. Neurologic: Grossly intact, no focal deficits, moving all 4 extremities. Psychiatric: Normal mood and affect.   Pertinent Imaging: EXAM: CT ABDOMEN AND PELVIS WITHOUT AND WITH CONTRAST  TECHNIQUE: Multidetector CT imaging of the abdomen and pelvis was performed following the standard protocol before and following the  bolus administration of intravenous contrast.  RADIATION DOSE REDUCTION: This exam was performed according to the departmental dose-optimization program which includes automated exposure control, adjustment of the mA and/or kV according to patient size and/or use of iterative reconstruction technique.  CONTRAST:  OMNIPAQUE IOHEXOL 300 MG/ML  SOLN  COMPARISON:  04/19/2017  FINDINGS: Lower chest: No acute abnormality. Small hiatal hernia. Coronary artery calcifications.  Hepatobiliary: No solid liver abnormality is seen. No gallstones, gallbladder wall thickening, or biliary dilatation.  Pancreas: Unremarkable. No pancreatic ductal dilatation or surrounding inflammatory changes.  Spleen: Normal in size without significant abnormality.  Adrenals/Urinary Tract: Adrenal glands are unremarkable. Simple, benign bilateral renal cortical cysts, for which no further follow-up or characterization is required. Kidneys are otherwise normal, without renal calculi, solid lesion, or hydronephrosis. Bladder is unremarkable. No urinary tract filling defect on delayed phase imaging.  Stomach/Bowel: Stomach is within normal limits. Appendix appears normal. No evidence of bowel wall thickening, distention, or inflammatory changes.  Vascular/Lymphatic: Aortic atherosclerosis. No enlarged abdominal or pelvic lymph nodes.  Reproductive: Prostatomegaly.  Other: Status post inguinal hernia mesh repair. Small fat containing bilateral hernias. No ascites.  Musculoskeletal: No acute or significant osseous findings.  IMPRESSION: 1. No CT findings to explain hematuria. No evidence of urinary tract calculus, mass, or hydronephrosis. No urinary tract filling defect on delayed phase imaging. 2. Prostatomegaly. 3. Coronary artery disease.  Aortic Atherosclerosis (ICD10-I70.0).   Electronically Signed By: Jearld Lesch M.D. On: 10/20/2022 21:50  This was personally reviewed and I agree  with the radiologic interpretation.   Assessment & Plan:    1. History of prostate cancer - In light of his history of prostate and rising PSA, we recommend a prostate MRI at this point to assess for an high grade or suspicious lesions  -He is reassured by his PSA stability like to hold off for the time being.  This seems reasonable.  Will plan to recheck his PSA in 6 months, if his PSA is rising, will then proceed with prostate MRI. - If this is normal, we will likely continue to trend after an absence of data for 2 years  2. BPH - Continue Flomax - No additional medications are required at this time as he reports no urinary symptoms.  3. History of hematuria CT urogram negative  Return in about 6 months (around 06/18/2023) for repeat PSA and evaluation. If levels increase, proceed with prostate MRI.  I have reviewed the above documentation for accuracy and completeness, and I agree with the above.   [name], MD    [facility] [address], [address] [place], [place] [address] [phone]

## 2023-04-06 ENCOUNTER — Ambulatory Visit
Admission: RE | Admit: 2023-04-06 | Discharge: 2023-04-06 | Disposition: A | Discharge status: Home / Self Care | Payer: Self-pay | Source: Ambulatory Visit | Attending: Internal Medicine | Admitting: Internal Medicine

## 2023-04-06 DIAGNOSIS — Z122 Encounter for screening for malignant neoplasm of respiratory organs: Secondary | ICD-10-CM | POA: Insufficient documentation

## 2023-04-06 DIAGNOSIS — F17210 Nicotine dependence, cigarettes, uncomplicated: Secondary | ICD-10-CM | POA: Insufficient documentation

## 2023-04-06 DIAGNOSIS — Z87891 Personal history of nicotine dependence: Secondary | ICD-10-CM | POA: Diagnosis present

## 2023-05-14 ENCOUNTER — Other Ambulatory Visit: Payer: Self-pay | Admitting: Acute Care

## 2023-05-14 DIAGNOSIS — F17210 Nicotine dependence, cigarettes, uncomplicated: Secondary | ICD-10-CM

## 2023-05-14 DIAGNOSIS — Z87891 Personal history of nicotine dependence: Secondary | ICD-10-CM

## 2023-05-14 DIAGNOSIS — Z122 Encounter for screening for malignant neoplasm of respiratory organs: Secondary | ICD-10-CM

## 2023-06-15 ENCOUNTER — Other Ambulatory Visit: Payer: Self-pay

## 2023-06-15 DIAGNOSIS — N400 Enlarged prostate without lower urinary tract symptoms: Secondary | ICD-10-CM

## 2023-06-15 DIAGNOSIS — C61 Malignant neoplasm of prostate: Secondary | ICD-10-CM

## 2023-06-19 ENCOUNTER — Other Ambulatory Visit: Payer: Self-pay

## 2023-06-19 DIAGNOSIS — C61 Malignant neoplasm of prostate: Secondary | ICD-10-CM

## 2023-06-19 DIAGNOSIS — N400 Enlarged prostate without lower urinary tract symptoms: Secondary | ICD-10-CM

## 2023-06-20 LAB — PSA: Prostate Specific Ag, Serum: 7.7 ng/mL — ABNORMAL HIGH (ref 0.0–4.0)

## 2023-06-22 ENCOUNTER — Ambulatory Visit: Payer: Self-pay | Admitting: Physician Assistant

## 2023-07-02 ENCOUNTER — Encounter: Payer: Self-pay | Admitting: Physician Assistant

## 2023-07-02 ENCOUNTER — Ambulatory Visit: Admitting: Physician Assistant

## 2023-07-02 VITALS — BP 145/91 | HR 71 | Ht 65.0 in | Wt 183.0 lb

## 2023-07-02 DIAGNOSIS — C61 Malignant neoplasm of prostate: Secondary | ICD-10-CM

## 2023-07-02 NOTE — Progress Notes (Signed)
 07/02/2023 1:42 PM   Dale Palmer July 08, 1950 [id number]  CC: Chief Complaint  Patient presents with   Follow-up   HPI: Dale Palmer is a 73 y.o. male with PMH BPH on Flomax , low-volume, low risk prostate cancer on active surveillance, and microscopic hematuria with benign workup in 2024 who presents today for 36-month follow-up.   Today he reports no changes in his voiding or health in the last 6 months.  He stopped Flomax  and has not noticed worsening in his voiding symptoms.  He has nocturia x 1.  Notably, he denies metal devices or pieces in his body.  He is not claustrophobic.  PSA trend: 7.7 on 06/19/2023 6.65 on 01/30/2023 7.3 on 12/14/2022 7.3 on 08/29/2022 5.20 on 11/01/2020 4.83 on 05/05/2019 6.1 on 12/10/2018 5.5  on 09/04/2018 3.68 on 03/2017 4.38 on 08/2016 3.55 on 02/2016 5.95 on 08/2015 3.27 on 08/2013  PMH: Past Medical History:  Diagnosis Date   Anemia    B12 deficiency    ED (erectile dysfunction) 07/12/2017   Elevated PSA, less than 10 ng/ml 09/22/2015   Erectile dysfunction    Hypertension    Tobacco abuse 10/11/2016    Surgical History: Past Surgical History:  Procedure Laterality Date   ESOPHAGOGASTRODUODENOSCOPY (EGD) WITH PROPOFOL  N/A 04/28/2019   Procedure: ESOPHAGOGASTRODUODENOSCOPY (EGD) WITH PROPOFOL ;  Surgeon: Luke Salaam, MD;  Location: [facility] ENDOSCOPY;  Service: Gastroenterology;  Laterality: N/A;   INGUINAL HERNIA REPAIR Bilateral 08/24/2017   Procedure: LAPAROSCOPIC INGUINAL HERNIA, bilateral;  Surgeon: Claudia Cuff, MD;  Location: [facility] ORS;  Service: General;  Laterality: Bilateral;   none      Home Medications:  Allergies as of 07/02/2023   No Known Allergies      Medication List        Accurate as of July 02, 2023  1:42 PM. If you have any questions, ask your nurse or doctor.          aspirin EC 81 MG tablet TAKE 1 TABLET (81 MG TOTAL) BY MOUTH ONCE DAILY.   lisinopril-hydrochlorothiazide 20-25 MG tablet Commonly  known as: ZESTORETIC Take 1 tablet by mouth daily.   tamsulosin  0.4 MG Caps capsule Commonly known as: FLOMAX  TAKE 1 CAPSULE (0.4 MG TOTAL) BY MOUTH ONCE DAILY. TAKE 30 MINUTES AFTER SAME MEAL EACH DAY.   VITAMIN B-12 PO Take 1 tablet by mouth daily.        Allergies:  No Known Allergies  Family History: Family History  Problem Relation Age of Onset   Prostate cancer Neg Hx    Kidney cancer Neg Hx     Social History:   reports that he has been smoking cigarettes. He has a 22 pack-year smoking history. He has never used smokeless tobacco. He reports current alcohol use. He reports that he does not use drugs.  Physical Exam: BP (!) 145/91   Pulse 71   Ht 5\' 5"  (1.651 m)   Wt 183 lb (83 kg)   BMI 30.45 kg/m   Constitutional:  Alert and oriented, no acute distress, nontoxic appearing HEENT: [place], AT Cardiovascular: No clubbing, cyanosis, or edema Respiratory: Normal respiratory effort, no increased work of breathing Skin: No rashes, bruises or suspicious lesions Neurologic: Grossly intact, no focal deficits, moving all 4 extremities Psychiatric: Normal mood and affect  Laboratory Data: Results for orders placed or performed in visit on 06/19/23  PSA   Collection Time: 06/19/23 10:23 AM  Result Value Ref Range   Prostate Specific Ag, Serum 7.7 (H)  0.0 - 4.0 ng/mL   Assessment & Plan:   1. Prostate cancer (HCC) (Primary) PSA rose slightly compared to prior.  Velocity is borderline.  I offered him a prostate MRI now versus repeat PSA in 3 months and he elected for the latter.  We discussed that if his PSA is stable, we can continue to defer MRI and resume every 6 months monitoring.  If PSA continues to rise in 3 months, we will recommend pursuing MRI at that time.  He is in agreement.  Return in about 3 months (around 10/02/2023) for Lab visit for PSA.  Kathreen Pare, PA-C  [facility] [place] [address], [address] [place], [place]  [address] [phone]

## 2023-10-02 ENCOUNTER — Other Ambulatory Visit

## 2023-10-02 DIAGNOSIS — C61 Malignant neoplasm of prostate: Secondary | ICD-10-CM

## 2023-10-03 LAB — PSA: Prostate Specific Ag, Serum: 7.1 ng/mL — ABNORMAL HIGH (ref 0.0–4.0)

## 2023-10-12 ENCOUNTER — Ambulatory Visit: Payer: Self-pay | Admitting: Physician Assistant

## 2023-10-15 ENCOUNTER — Other Ambulatory Visit: Payer: Self-pay

## 2023-10-15 DIAGNOSIS — C61 Malignant neoplasm of prostate: Secondary | ICD-10-CM

## 2024-04-07 ENCOUNTER — Other Ambulatory Visit

## 2024-04-14 ENCOUNTER — Ambulatory Visit: Admitting: Urology
